# Patient Record
Sex: Male | Born: 1955 | Race: Black or African American | Hispanic: No | Smoking: Former smoker
Health system: Southern US, Community
[De-identification: ages and names within clinical notes are randomized; demographics above are authoritative.]

## PROBLEM LIST (undated history)

## (undated) DIAGNOSIS — F329 Major depressive disorder, single episode, unspecified: Secondary | ICD-10-CM

## (undated) DIAGNOSIS — G8929 Other chronic pain: Secondary | ICD-10-CM

## (undated) DIAGNOSIS — F32A Depression, unspecified: Secondary | ICD-10-CM

## (undated) DIAGNOSIS — M549 Dorsalgia, unspecified: Secondary | ICD-10-CM

---
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
## 1998-05-28 ENCOUNTER — Ambulatory Visit (HOSPITAL_COMMUNITY): Admission: RE | Admit: 1998-05-28 | Discharge: 1998-05-28 | Payer: Self-pay | Admitting: Family Medicine

## 1998-05-28 ENCOUNTER — Encounter: Payer: Self-pay | Admitting: Family Medicine

## 1998-06-11 ENCOUNTER — Encounter: Payer: Self-pay | Admitting: Orthopedic Surgery

## 1998-06-11 ENCOUNTER — Ambulatory Visit (HOSPITAL_COMMUNITY): Admission: RE | Admit: 1998-06-11 | Discharge: 1998-06-11 | Payer: Self-pay | Admitting: Family Medicine

## 1998-06-25 ENCOUNTER — Ambulatory Visit (HOSPITAL_COMMUNITY): Admission: RE | Admit: 1998-06-25 | Discharge: 1998-06-25 | Payer: Self-pay | Admitting: Family Medicine

## 1998-08-14 ENCOUNTER — Ambulatory Visit (HOSPITAL_COMMUNITY): Admission: RE | Admit: 1998-08-14 | Discharge: 1998-08-14 | Payer: Self-pay | Admitting: Family Medicine

## 1998-09-29 ENCOUNTER — Ambulatory Visit (HOSPITAL_COMMUNITY): Admission: RE | Admit: 1998-09-29 | Discharge: 1998-09-29 | Payer: Self-pay | Admitting: Neurosurgery

## 1998-10-13 ENCOUNTER — Encounter: Admission: RE | Admit: 1998-10-13 | Discharge: 1998-11-21 | Payer: Self-pay | Admitting: Family Medicine

## 1998-10-21 ENCOUNTER — Ambulatory Visit (HOSPITAL_COMMUNITY): Admission: RE | Admit: 1998-10-21 | Discharge: 1998-10-21 | Payer: Self-pay | Admitting: Neurosurgery

## 1998-10-28 ENCOUNTER — Ambulatory Visit (HOSPITAL_COMMUNITY): Admission: RE | Admit: 1998-10-28 | Discharge: 1998-10-28 | Payer: Self-pay | Admitting: Family Medicine

## 1998-10-28 ENCOUNTER — Encounter: Payer: Self-pay | Admitting: Family Medicine

## 1999-01-19 ENCOUNTER — Ambulatory Visit (HOSPITAL_COMMUNITY): Admission: RE | Admit: 1999-01-19 | Discharge: 1999-01-19 | Payer: Self-pay | Admitting: Family Medicine

## 1999-01-23 ENCOUNTER — Encounter: Payer: Self-pay | Admitting: Family Medicine

## 1999-01-23 ENCOUNTER — Ambulatory Visit (HOSPITAL_COMMUNITY): Admission: RE | Admit: 1999-01-23 | Discharge: 1999-01-23 | Payer: Self-pay | Admitting: Family Medicine

## 1999-02-04 ENCOUNTER — Ambulatory Visit (HOSPITAL_COMMUNITY): Admission: RE | Admit: 1999-02-04 | Discharge: 1999-02-04 | Payer: Self-pay | Admitting: Family Medicine

## 1999-02-04 ENCOUNTER — Encounter: Payer: Self-pay | Admitting: Family Medicine

## 1999-03-17 ENCOUNTER — Ambulatory Visit (HOSPITAL_COMMUNITY): Admission: RE | Admit: 1999-03-17 | Discharge: 1999-03-17 | Payer: Self-pay | Admitting: Orthopaedic Surgery

## 1999-08-27 ENCOUNTER — Emergency Department (HOSPITAL_COMMUNITY): Admission: EM | Admit: 1999-08-27 | Discharge: 1999-08-27 | Payer: Self-pay | Admitting: *Deleted

## 1999-10-22 ENCOUNTER — Ambulatory Visit (HOSPITAL_COMMUNITY): Admission: RE | Admit: 1999-10-22 | Discharge: 1999-10-22 | Payer: Self-pay | Admitting: Neurosurgery

## 2000-02-11 ENCOUNTER — Emergency Department (HOSPITAL_COMMUNITY): Admission: EM | Admit: 2000-02-11 | Discharge: 2000-02-11 | Payer: Self-pay | Admitting: Emergency Medicine

## 2000-02-13 ENCOUNTER — Emergency Department (HOSPITAL_COMMUNITY): Admission: EM | Admit: 2000-02-13 | Discharge: 2000-02-13 | Payer: Self-pay | Admitting: Emergency Medicine

## 2001-02-21 ENCOUNTER — Emergency Department (HOSPITAL_COMMUNITY): Admission: EM | Admit: 2001-02-21 | Discharge: 2001-02-21 | Payer: Self-pay | Admitting: Emergency Medicine

## 2001-03-21 ENCOUNTER — Encounter: Payer: Self-pay | Admitting: Family Medicine

## 2001-03-21 ENCOUNTER — Ambulatory Visit (HOSPITAL_COMMUNITY): Admission: RE | Admit: 2001-03-21 | Discharge: 2001-03-21 | Payer: Self-pay | Admitting: Family Medicine

## 2001-05-11 ENCOUNTER — Encounter: Admission: RE | Admit: 2001-05-11 | Discharge: 2001-08-09 | Payer: Self-pay

## 2001-06-22 ENCOUNTER — Encounter: Admission: RE | Admit: 2001-06-22 | Discharge: 2001-07-12 | Payer: Self-pay

## 2001-08-17 ENCOUNTER — Encounter: Admission: RE | Admit: 2001-08-17 | Discharge: 2001-09-20 | Payer: Self-pay

## 2001-09-14 ENCOUNTER — Emergency Department (HOSPITAL_COMMUNITY): Admission: EM | Admit: 2001-09-14 | Discharge: 2001-09-14 | Payer: Self-pay | Admitting: Emergency Medicine

## 2001-09-20 ENCOUNTER — Encounter: Admission: RE | Admit: 2001-09-20 | Discharge: 2001-12-19 | Payer: Self-pay

## 2001-12-12 ENCOUNTER — Encounter: Payer: Self-pay | Admitting: Emergency Medicine

## 2001-12-12 ENCOUNTER — Emergency Department (HOSPITAL_COMMUNITY): Admission: EM | Admit: 2001-12-12 | Discharge: 2001-12-13 | Payer: Self-pay | Admitting: *Deleted

## 2002-10-04 ENCOUNTER — Emergency Department (HOSPITAL_COMMUNITY): Admission: EM | Admit: 2002-10-04 | Discharge: 2002-10-04 | Payer: Self-pay | Admitting: Emergency Medicine

## 2002-10-04 ENCOUNTER — Encounter: Payer: Self-pay | Admitting: Emergency Medicine

## 2003-01-08 ENCOUNTER — Encounter: Payer: Self-pay | Admitting: Emergency Medicine

## 2003-01-08 ENCOUNTER — Emergency Department (HOSPITAL_COMMUNITY): Admission: EM | Admit: 2003-01-08 | Discharge: 2003-01-09 | Payer: Self-pay | Admitting: Emergency Medicine

## 2004-11-19 ENCOUNTER — Emergency Department (HOSPITAL_COMMUNITY): Admission: EM | Admit: 2004-11-19 | Discharge: 2004-11-20 | Payer: Self-pay | Admitting: Emergency Medicine

## 2004-11-21 ENCOUNTER — Emergency Department (HOSPITAL_COMMUNITY): Admission: EM | Admit: 2004-11-21 | Discharge: 2004-11-21 | Payer: Self-pay | Admitting: Emergency Medicine

## 2013-03-18 ENCOUNTER — Encounter (HOSPITAL_COMMUNITY): Payer: Self-pay | Admitting: *Deleted

## 2013-03-18 ENCOUNTER — Emergency Department (HOSPITAL_COMMUNITY)
Admission: EM | Admit: 2013-03-18 | Discharge: 2013-03-18 | Disposition: A | Discharge status: Home / Self Care | Payer: Non-veteran care | Attending: Emergency Medicine | Admitting: Emergency Medicine

## 2013-03-18 DIAGNOSIS — R21 Rash and other nonspecific skin eruption: Secondary | ICD-10-CM

## 2013-03-18 DIAGNOSIS — L299 Pruritus, unspecified: Secondary | ICD-10-CM | POA: Insufficient documentation

## 2013-03-18 DIAGNOSIS — G8929 Other chronic pain: Secondary | ICD-10-CM | POA: Insufficient documentation

## 2013-03-18 HISTORY — DX: Other chronic pain: G89.29

## 2013-03-18 HISTORY — DX: Dorsalgia, unspecified: M54.9

## 2013-03-18 MED ORDER — HYDROCORTISONE 2.5 % EX LOTN: TOPICAL_LOTION | Freq: Two times a day (BID) | CUTANEOUS | Status: DC

## 2013-03-18 MED ORDER — PREDNISONE 20 MG PO TABS: 40.0000 mg | ORAL_TABLET | Freq: Every day | ORAL | Status: DC

## 2013-03-18 NOTE — ED Provider Notes (Signed)
History  This chart was scribed for non-physician practitioner working with Carleene Cooper III, MD. This patient was seen in room TR05C/TR05C and the patient's care was started at 5:04 PM.  CSN: [id number]  Arrival date & time 03/18/13  1644   Chief Complaint  Patient presents with  . Rash    The history is provided by the patient. No language interpreter was used.   HPI Comments: Ian Montoya is a 57 y.o. male who presents to the Emergency Department complaining of a gradually improving rash to his face, abdomen and back first noticed yesterday morning when pt woke up. The rash resembles insect bites and there is associated itching but pt denies associated pain or drainage. Pt denies sleeping in new environments, states that he has been sleeping in his car. Pt denies presence of rash on his genitals, buttocks region or airway. He denies fever, chills, difficulty breathing or any other symptoms. Pt is an occasional alcohol user and denies smoking.  PCP- None   Past Medical History  Diagnosis Date  . Back pain, chronic    History reviewed. No pertinent past surgical history.  No family history on file.  History  Substance Use Topics  . Smoking status: Former Games developer  . Smokeless tobacco: Not on file  . Alcohol Use: Yes     Comment: occ    Review of Systems  Constitutional: Negative for fever and chills.  Respiratory: Negative for shortness of breath and wheezing.   Skin: Positive for rash.   Allergies  Tylenol  Home Medications   Current Outpatient Rx  Name  Route  Sig  Dispense  Refill  . hydrocortisone 2.5 % lotion   Topical   Apply topically 2 (two) times daily.   59 mL   0   . predniSONE (DELTASONE) 20 MG tablet   Oral   Take 2 tablets (40 mg total) by mouth daily.   10 tablet   0     Triage Vitals: BP 124/83  Pulse 74  Temp(Src) 98.2 F (36.8 C) (Oral)  Resp 16  SpO2 97%  Physical Exam  Nursing note and vitals reviewed. Constitutional: He is  oriented to person, place, and time. He appears well-developed and well-nourished. No distress.  HENT:  Head: Normocephalic and atraumatic.  Mouth/Throat: Uvula is midline and oropharynx is clear and moist. No oropharyngeal exudate, posterior oropharyngeal edema or posterior oropharyngeal erythema.  No oropharynx involvement.  Eyes: Conjunctivae are normal.  Neck: Neck supple.  Cardiovascular: Normal rate, regular rhythm and normal heart sounds.   Pulmonary/Chest: Effort normal and breath sounds normal. No accessory muscle usage. Not tachypneic and not bradypneic. No respiratory distress. He has no wheezes.  Neurological: He is alert and oriented to person, place, and time.  Skin: Skin is warm and dry. Rash noted. Rash is maculopapular. He is not diaphoretic.  Face, neck, stomach, back, legs involved. No genitalia involvement. Small abrasions noted on face, patient endorses scratching. No drainage or bleeding.   Psychiatric: He has a normal mood and affect.    ED Course  Procedures (including critical care time)  DIAGNOSTIC STUDIES: Oxygen Saturation is 97% on RA, normal by my interpretation.    COORDINATION OF CARE: 5:35 PM- Pt advised of plan to receive a prednisone prescription upon discharge and pt agrees. Pt also     Labs Reviewed - No data to display  No results found.  1. Rash and nonspecific skin eruption     MDM  No evidence of  SJS or necrotizing fasciitis. Due to pruritic and not painful nature of blisters do not suspect pemphigus vulgaris. Pustules do not resemble scabies as per pt hx or allergic reaction. No blisters, no pustules, no warmth, no draining sinus tracts, no superficial abscesses, no bullous impetigo, no vesicles, no desquamation, no target lesions with dusky purpura or a central bulla. Not tender to touch. No fevers or chills. Return precautions discussed. Patient is agreeable to plan. Patient is stable at time of discharge.      I personally performed  the services described in this documentation, which was scribed in my presence. The recorded information has been reviewed and is accurate.     Jeannetta Ellis, PA-C 03/18/13 1854

## 2013-03-18 NOTE — ED Notes (Signed)
Pt is here with rash versus bites to right face, abdomen, and back

## 2013-03-19 NOTE — ED Provider Notes (Signed)
Medical screening examination/treatment/procedure(s) were performed by non-physician practitioner and as supervising physician I was immediately available for consultation/collaboration.   Amarah Brossman III, MD 03/19/13 1756 

## 2013-08-28 ENCOUNTER — Emergency Department (HOSPITAL_COMMUNITY)
Admission: EM | Admit: 2013-08-28 | Discharge: 2013-08-28 | Disposition: A | Discharge status: Home / Self Care | Payer: Non-veteran care | Attending: Emergency Medicine | Admitting: Emergency Medicine

## 2013-08-28 ENCOUNTER — Encounter (HOSPITAL_COMMUNITY): Payer: Self-pay | Admitting: Emergency Medicine

## 2013-08-28 DIAGNOSIS — M545 Low back pain, unspecified: Secondary | ICD-10-CM | POA: Insufficient documentation

## 2013-08-28 DIAGNOSIS — Z888 Allergy status to other drugs, medicaments and biological substances status: Secondary | ICD-10-CM | POA: Insufficient documentation

## 2013-08-28 DIAGNOSIS — G8929 Other chronic pain: Secondary | ICD-10-CM | POA: Insufficient documentation

## 2013-08-28 DIAGNOSIS — Z87891 Personal history of nicotine dependence: Secondary | ICD-10-CM | POA: Insufficient documentation

## 2013-08-28 MED ORDER — HYDROMORPHONE HCL PF 1 MG/ML IJ SOLN
1.0000 mg | Freq: Once | INTRAMUSCULAR | Status: AC
  Administered 2013-08-28: 1 mg via INTRAMUSCULAR
  Filled 2013-08-28: qty 1

## 2013-08-28 MED ORDER — PREDNISONE 10 MG PO TABS: 20.0000 mg | ORAL_TABLET | Freq: Every day | ORAL | Status: DC

## 2013-08-28 MED ORDER — OXYCODONE HCL 5 MG PO TABS: 5.0000 mg | ORAL_TABLET | Freq: Four times a day (QID) | ORAL | Status: DC | PRN

## 2013-08-28 NOTE — ED Provider Notes (Signed)
Medical screening examination/treatment/procedure(s) were performed by non-physician practitioner and as supervising physician I was immediately available for consultation/collaboration.    Celene Kras, MD 08/28/13 [phone]

## 2013-08-28 NOTE — ED Provider Notes (Signed)
CSN: [id number]     Arrival date & time 08/28/13  1411 History  This chart was scribed for Felicie Morn, NP, working with Celene Kras, MD, by Ardelia Mems ED Scribe. This patient was seen in room TR06C/TR06C and the patient's care was started at 3:20 PM.   Chief Complaint  Patient presents with  . Back Pain    Patient is a 57 y.o. male presenting with back pain. The history is provided by the patient. No language interpreter was used.  Back Pain Location:  Lumbar spine Quality:  Aching Radiates to:  L posterior upper leg and R posterior upper leg Pain severity:  Moderate Onset quality:  Gradual Duration:  1 day Timing:  Constant Progression:  Worsening Chronicity:  Chronic (but acutely worsened) Context: not recent injury   Relieved by:  Nothing Worsened by:  Nothing tried Ineffective treatments:  None tried (ran out of prescribed pain medications) Associated symptoms: no abdominal pain, no bladder incontinence, no bowel incontinence, no fever, no numbness and no weakness     HPI Comments: Ian Montoya is a 57 y.o. Male with a history of chronic lower back pain who presents to the Emergency Department complaining of acutely worsened lower back pain today. He denies any recent injury to have onset worsened his pain. He states that he has ran out of his prescribed pain medications, and that he dropped his last remaining pills in the toilet accidentally this morning. He states that he is followed for this pain by the [place] in [place]. He states that he has not been on any steroids recently. He states that he is able to ambulate normally, but that walking worsens his pain. He denies any history of DM. He denies bowel or bladder incontinence or any other pain or symptoms.   Past Medical History  Diagnosis Date  . Back pain, chronic    History reviewed. No pertinent past surgical history. History reviewed. No pertinent family history. History  Substance Use Topics  . Smoking status:  Former Games developer  . Smokeless tobacco: Not on file  . Alcohol Use: Yes     Comment: occ    Review of Systems  Constitutional: Negative for fever.  Gastrointestinal: Negative for abdominal pain and bowel incontinence.  Genitourinary: Negative for bladder incontinence.  Musculoskeletal: Positive for back pain.  Neurological: Negative for weakness and numbness.  All other systems reviewed and are negative.   Allergies  Tylenol  Home Medications   Current Outpatient Rx  Name  Route  Sig  Dispense  Refill  . oxyCODONE (ROXICODONE) 5 MG immediate release tablet   Oral   Take 1 tablet (5 mg total) by mouth every 6 (six) hours as needed for severe pain.   15 tablet   0   . predniSONE (DELTASONE) 10 MG tablet   Oral   Take 2 tablets (20 mg total) by mouth daily.   14 tablet   0    Triage Vitals: BP 135/92  Pulse 69  Temp(Src) 97.6 F (36.4 C) (Oral)  Resp 18  SpO2 97%  Physical Exam  Nursing note and vitals reviewed. Constitutional: He is oriented to person, place, and time. He appears well-developed and well-nourished. No distress.  HENT:  Head: Normocephalic and atraumatic.  Eyes: EOM are normal.  Neck: Neck supple. No tracheal deviation present.  Cardiovascular: Normal rate.   Pulmonary/Chest: Effort normal. No respiratory distress.  Musculoskeletal: Normal range of motion.  Chronic low back pain.  Neurological: He is alert  and oriented to person, place, and time.  Able to ambulate.  Skin: Skin is warm and dry.  Psychiatric: He has a normal mood and affect. His behavior is normal.    ED Course  Procedures (including critical care time)  DIAGNOSTIC STUDIES: Oxygen Saturation is 97% on RA, normal by my interpretation.    COORDINATION OF CARE: 3:25 PM- Will order Dilaudid. Will discharge with Prednisone and Oxycodone. Pt advised of plan for treatment and pt agrees.  Medications  HYDROmorphone (DILAUDID) injection 1 mg (1 mg Intramuscular Given 08/28/13 1537)    Labs Review Labs Reviewed - No data to display Imaging Review No results found.  EKG Interpretation   None       MDM  Chronic back pain.  [facility].  Patient states he "lost" his pain medication in the toilet this morning.  No red flag symptoms.  I personally performed the services described in this documentation, which was scribed in my presence. The recorded information has been reviewed and is accurate.    Jimmye Norman, NP 08/28/13 [phone]

## 2013-08-28 NOTE — ED Notes (Signed)
Pt c/o chronic pain that is increased in his back and down legs; pt sts out of pain meds; pt denies new injury

## 2017-02-02 ENCOUNTER — Emergency Department (HOSPITAL_COMMUNITY)
Admission: EM | Admit: 2017-02-02 | Discharge: 2017-02-02 | Disposition: A | Discharge status: Home / Self Care | Payer: Non-veteran care | Attending: Emergency Medicine | Admitting: Emergency Medicine

## 2017-02-02 ENCOUNTER — Encounter (HOSPITAL_COMMUNITY): Payer: Self-pay

## 2017-02-02 DIAGNOSIS — H9201 Otalgia, right ear: Secondary | ICD-10-CM | POA: Insufficient documentation

## 2017-02-02 DIAGNOSIS — K047 Periapical abscess without sinus: Secondary | ICD-10-CM | POA: Insufficient documentation

## 2017-02-02 DIAGNOSIS — Z87891 Personal history of nicotine dependence: Secondary | ICD-10-CM | POA: Insufficient documentation

## 2017-02-02 MED ORDER — AMOXICILLIN 500 MG PO CAPS: 500.0000 mg | ORAL_CAPSULE | Freq: Three times a day (TID) | ORAL | 0 refills | Status: DC

## 2017-02-02 MED ORDER — NAPROXEN 500 MG PO TABS: 500.0000 mg | ORAL_TABLET | Freq: Two times a day (BID) | ORAL | 0 refills | Status: DC

## 2017-02-02 NOTE — ED Provider Notes (Signed)
[facility]-EMERGENCY DEPT Provider Note   CSN: [id number] Arrival date & time: 02/02/17  1841   By signing my name below, I, Soijett Blue, attest that this documentation has been prepared under the direction and in the presence of Ian BuffaloHope Kion Huntsberry, NP Electronically Signed: Soijett Blue, ED Scribe. 02/02/17. 9:01 PM.  History   Chief Complaint No chief complaint on file.   HPI Ian Montoya is a 61 y.o. male who presents to the Emergency Department complaining of constant, right ear pain onset 2 days ago. Pt reports associated right lower dental pain. Pt has not tried any medications for the relief of his symptoms. Denies nasal congestion, fever, chills, nausea, vomiting, gland swelling, abdominal pain, and any other symptoms.    The history is provided by the patient. No language interpreter was used.  Otalgia  This is a new problem. The current episode started 2 days ago. There is pain in the right ear. The problem occurs constantly. The problem has not changed since onset.There has been no fever. Pertinent negatives include no abdominal pain, no vomiting, no neck pain and no rash.    Past Medical History:  Diagnosis Date  . Back pain, chronic     There are no active problems to display for this patient.   History reviewed. No pertinent surgical history.     Home Medications    Prior to Admission medications   Medication Sig Start Date End Date Taking? Authorizing Provider  amoxicillin (AMOXIL) 500 MG capsule Take 1 capsule (500 mg total) by mouth 3 (three) times daily. 02/02/17   Janne NapoleonNeese, Stephonie Wilcoxen M, NP  naproxen (NAPROSYN) 500 MG tablet Take 1 tablet (500 mg total) by mouth 2 (two) times daily. 02/02/17   Janne NapoleonNeese, Jacquelyne Quarry M, NP  oxyCODONE (ROXICODONE) 5 MG immediate release tablet Take 1 tablet (5 mg total) by mouth every 6 (six) hours as needed for severe pain. 08/28/13   Felicie MornSmith, David, NP  predniSONE (DELTASONE) 10 MG tablet Take 2 tablets (20 mg total) by mouth daily. 08/28/13   Felicie MornSmith,  David, NP    Family History No family history on file.  Social History Social History  Substance Use Topics  . Smoking status: Former Games developermoker  . Smokeless tobacco: Never Used  . Alcohol use Yes     Comment: occ     Allergies   Tylenol [acetaminophen]   Review of Systems Review of Systems  Constitutional: Negative for chills and fever.  HENT: Positive for dental problem (right lower) and ear pain (right). Negative for congestion.   Respiratory: Negative for shortness of breath.   Gastrointestinal: Negative for abdominal pain, nausea and vomiting.  Musculoskeletal: Negative for neck pain.  Skin: Negative for rash.  Hematological: Negative for adenopathy.  Psychiatric/Behavioral: The patient is not nervous/anxious.      Physical Exam Updated Vital Signs BP 121/81 (BP Location: Right Arm)   Pulse 76   Temp 99.1 F (37.3 C) (Oral)   Resp 18   SpO2 97%   Physical Exam  Constitutional: He is oriented to person, place, and time. He appears well-developed and well-nourished. No distress.  HENT:  Head: Normocephalic and atraumatic.  Right Ear: Tympanic membrane and ear canal normal.  Left Ear: Tympanic membrane and ear canal normal.  Mouth/Throat: Uvula is midline, oropharynx is clear and moist and mucous membranes are normal. Abnormal dentition. Dental abscesses and dental caries present. No posterior oropharyngeal edema or posterior oropharyngeal erythema.  Second molar on right lower decayed to gumline. Swelling and tenderness  of gum surrounding the tooth. Abscessed tooth to the 1st and 2nd molar to left lower and the abscess is draining. Multiple dental caries to the upper dental area. No TMJ click or tenderness.  Eyes: Conjunctivae and EOM are normal.  Neck: Normal range of motion. Neck supple.  Cardiovascular: Normal rate and regular rhythm.   Pulmonary/Chest: Effort normal and breath sounds normal.  Abdominal: Soft. There is no tenderness.  Musculoskeletal: Normal  range of motion.  Lymphadenopathy:    He has no cervical adenopathy.  Neurological: He is alert and oriented to person, place, and time.  Skin: Skin is warm and dry.  Psychiatric: He has a normal mood and affect. His behavior is normal.  Nursing note and vitals reviewed.    ED Treatments / Results  DIAGNOSTIC STUDIES: Oxygen Saturation is 97% on RA, nl by my interpretation.    COORDINATION OF CARE: 8:59 PM Discussed treatment plan with pt at bedside which includes abx Rx, referral to dentist, and pt agreed to plan.   Labs (all labs ordered are listed, but only abnormal results are displayed) Labs Reviewed - No data to display  Radiology No results found.  Procedures Procedures (including critical care time)  Medications Ordered in ED Medications - No data to display   Initial Impression / Assessment and Plan / ED Course  I have reviewed the triage vital signs and the nursing notes.  Patient right ear pain is referred pain from multiple right upper and lower dental caries and abscessed teeth. Pt with dentalgia.  No abscess requiring immediate incision and drainage.  Exam not concerning for Ludwig's angina or pharyngeal abscess.  Will treat with amoxil Rx. Pt instructed to follow-up with dentist.  Discussed return precautions. Pt safe for discharge.  Final Clinical Impressions(s) / ED Diagnoses   Final diagnoses:  Dental abscess    New Prescriptions Discharge Medication List as of 02/02/2017  9:02 PM    START taking these medications   Details  amoxicillin (AMOXIL) 500 MG capsule Take 1 capsule (500 mg total) by mouth 3 (three) times daily., Starting Wed 02/02/2017, Print    naproxen (NAPROSYN) 500 MG tablet Take 1 tablet (500 mg total) by mouth 2 (two) times daily., Starting Wed 02/02/2017, Print      I personally performed the services described in this documentation, which was scribed in my presence. The recorded information has been reviewed and is accurate.      Ian Buffalo [place], [place] 02/03/17 [employer]    Derwood Kaplan, MD 02/04/17 1655

## 2017-02-02 NOTE — ED Triage Notes (Signed)
Patient complains of right ear pain x 2 days, no cold symptoms associated with same. Thinks started after using q-tip, NAD

## 2017-02-02 NOTE — Discharge Instructions (Signed)
Take the medication as directed. Call Dr. Kelly SplinterKoelling's office in the morning for follow up.

## 2019-03-03 ENCOUNTER — Encounter (HOSPITAL_COMMUNITY): Payer: Self-pay | Admitting: Emergency Medicine

## 2019-03-03 ENCOUNTER — Other Ambulatory Visit: Payer: Self-pay

## 2019-03-03 ENCOUNTER — Emergency Department (HOSPITAL_COMMUNITY): Payer: No Typology Code available for payment source

## 2019-03-03 ENCOUNTER — Emergency Department (HOSPITAL_COMMUNITY)
Admission: EM | Admit: 2019-03-03 | Discharge: 2019-03-03 | Disposition: A | Discharge status: Home / Self Care | Payer: No Typology Code available for payment source | Attending: Emergency Medicine | Admitting: Emergency Medicine

## 2019-03-03 DIAGNOSIS — Y9389 Activity, other specified: Secondary | ICD-10-CM | POA: Diagnosis not present

## 2019-03-03 DIAGNOSIS — Z87891 Personal history of nicotine dependence: Secondary | ICD-10-CM | POA: Diagnosis not present

## 2019-03-03 DIAGNOSIS — G8911 Acute pain due to trauma: Secondary | ICD-10-CM | POA: Insufficient documentation

## 2019-03-03 DIAGNOSIS — Y999 Unspecified external cause status: Secondary | ICD-10-CM | POA: Insufficient documentation

## 2019-03-03 DIAGNOSIS — W11XXXA Fall on and from ladder, initial encounter: Secondary | ICD-10-CM | POA: Insufficient documentation

## 2019-03-03 DIAGNOSIS — S4992XA Unspecified injury of left shoulder and upper arm, initial encounter: Secondary | ICD-10-CM | POA: Diagnosis not present

## 2019-03-03 DIAGNOSIS — M87022 Idiopathic aseptic necrosis of left humerus: Secondary | ICD-10-CM | POA: Diagnosis not present

## 2019-03-03 DIAGNOSIS — Y92008 Other place in unspecified non-institutional (private) residence as the place of occurrence of the external cause: Secondary | ICD-10-CM | POA: Insufficient documentation

## 2019-03-03 DIAGNOSIS — M25512 Pain in left shoulder: Secondary | ICD-10-CM

## 2019-03-03 MED ORDER — OXYCODONE HCL 5 MG PO TABS
5.0000 mg | ORAL_TABLET | Freq: Once | ORAL | Status: AC
  Administered 2019-03-03: 5 mg via ORAL
  Filled 2019-03-03: qty 1

## 2019-03-03 MED ORDER — LIDOCAINE 5 % EX PTCH: 1.0000 | MEDICATED_PATCH | CUTANEOUS | 0 refills | Status: DC

## 2019-03-03 NOTE — ED Notes (Signed)
Patient transported to X-ray 

## 2019-03-03 NOTE — ED Triage Notes (Signed)
Pt here for shoulder pain following fall off of step later. States he fell on concrete after slipping and pain to left shoulder. Pt able to move arm freely. Small abrasion noted to top of shoulder. No syncope, LOC

## 2019-03-03 NOTE — ED Provider Notes (Signed)
[facility] Emergency Department Provider Note MRN:  [id number]  Arrival date & time: 03/03/19     Chief Complaint   Shoulder Pain   History of Present Illness   Ian Montoya is a 63 y.o. year-old male with a history of chronic back pain presenting to the ED with chief complaint of shoulder pain.  Shortly prior to arrival, patient was on a 2 or 3 step ladder reaching for something in the garage, upon trying to step down off the small step ladder, he lost his balance, fell onto his left shoulder.  Denies head trauma, no loss of consciousness, no nausea or vomiting, no neck pain, back pain is at baseline, no chest pain or shortness of breath, no abdominal pain.  Endorsing pain to the left shoulder, worse with motion, moderate in severity.  Review of Systems  A complete 10 system review of systems was obtained and all systems are negative except as noted in the HPI and PMH.   Patient's Health History    Past Medical History:  Diagnosis Date  . Back pain, chronic     History reviewed. No pertinent surgical history.  No family history on file.  Social History   Socioeconomic History  . Marital status: Divorced    Spouse name: Not on file  . Number of children: Not on file  . Years of education: Not on file  . Highest education level: Not on file  Occupational History  . Not on file  Social Needs  . Financial resource strain: Not on file  . Food insecurity    Worry: Not on file    Inability: Not on file  . Transportation needs    Medical: Not on file    Non-medical: Not on file  Tobacco Use  . Smoking status: Former Research scientist (life sciences)  . Smokeless tobacco: Never Used  Substance and Sexual Activity  . Alcohol use: Yes    Comment: occ  . Drug use: No  . Sexual activity: Not on file  Lifestyle  . Physical activity    Days per week: Not on file    Minutes per session: Not on file  . Stress: Not on file  Relationships  . Social Herbalist on phone:  Not on file    Gets together: Not on file    Attends religious service: Not on file    Active member of club or organization: Not on file    Attends meetings of clubs or organizations: Not on file    Relationship status: Not on file  . Intimate partner violence    Fear of current or ex partner: Not on file    Emotionally abused: Not on file    Physically abused: Not on file    Forced sexual activity: Not on file  Other Topics Concern  . Not on file  Social History Narrative  . Not on file     Physical Exam  Vital Signs and Nursing Notes reviewed Vitals:   03/03/19 1230 03/03/19 1315  Pulse: 70 62  Resp:    Temp:    SpO2: 97% 96%    CONSTITUTIONAL: Well-appearing, NAD NEURO:  Alert and oriented x 3, no focal deficits EYES:  eyes equal and reactive ENT/NECK:  no LAD, no JVD CARDIO: Regular rate, well-perfused, normal S1 and S2 PULM:  CTAB no wheezing or rhonchi GI/GU:  normal bowel sounds, non-distended, non-tender MSK/SPINE:  No gross deformities, no edema, intermittent pain elicited to the left  shoulder with range of motion, which is grossly intact SKIN:  no rash, atraumatic PSYCH:  Appropriate speech and behavior  Diagnostic and Interventional Summary    EKG Interpretation  Date/Time:  Saturday March 03 2019 12:34:01 EDT Ventricular Rate:  68 PR Interval:    QRS Duration: 91 QT Interval:  397 QTC Calculation: 423 R Axis:   22 Text Interpretation:  Sinus rhythm Abnormal R-wave progression, early transition Confirmed by Kennis CarinaBero, Rilley Poulter [phone]([id number]) on 03/03/2019 1:36:36 PM      Labs Reviewed - No data to display  DG Shoulder Left  Final Result      Medications  oxyCODONE (Oxy IR/ROXICODONE) immediate release tablet 5 mg (5 mg Oral Given 03/03/19 1235)     Procedures Critical Care  ED Course and Medical Decision Making  I have reviewed the triage vital signs and the nursing notes.  Pertinent labs & imaging results that were available during my care of the  patient were reviewed by me and considered in my medical decision making (see below for details).  Suspect MSK strain or bruising related to the fall, nothing to suggest intracranial, intra-abdominal, or intrathoracic trauma, normal vital signs.  EKG and shoulder x-ray pending, anticipating discharge.  EKG is without acute concerns, x-ray reveals mild AC joint injury, also reveals some more chronic likely avascular necrosis of the humeral head.  Provided with sling for comfort, advised to range the shoulder a few times a day to prevent frozen shoulder, advised that he needs follow-up with a specialist within the next 1 to 2 weeks, more so for his possible avascular necrosis.  After the discussed management above, the patient was determined to be safe for discharge.  The patient was in agreement with this plan and all questions regarding their care were answered.  ED return precautions were discussed and the patient will return to the ED with any significant worsening of condition.  Elmer SowMichael M. Pilar PlateBero, MD [facility] [email or web address]  Final Clinical Impressions(s) / ED Diagnoses     ICD-10-CM   1. Acute pain of left shoulder  M25.512   2. Injury of left acromioclavicular joint, initial encounter  S49.92XA   3. 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

## 2019-03-03 NOTE — Discharge Instructions (Addendum)
You were evaluated in the Emergency Department and after careful evaluation, we did not find any emergent condition requiring admission or further testing in the hospital.  Your symptoms today seem to be due to an injury to the [facility] joint of your shoulder.  The x-ray also showed some chronic changes to your shoulder that need to be followed up with by an orthopedic specialist within the next 1 to 2 weeks.  If you cannot reach your [place] specialists, you can call the number provided.  Use the shoulder sling as needed for comfort, but be sure to range the shoulder a few times a day to prevent your shoulder from freezing up.  Please return to the Emergency Department if you experience any worsening of your condition.  We encourage you to follow up with a primary care provider.  Thank you for allowing [place] to be a part of your care.

## 2019-05-19 ENCOUNTER — Encounter (HOSPITAL_COMMUNITY): Payer: Self-pay | Admitting: Emergency Medicine

## 2019-05-19 ENCOUNTER — Emergency Department (HOSPITAL_COMMUNITY)
Admission: EM | Admit: 2019-05-19 | Discharge: 2019-05-19 | Disposition: A | Discharge status: Home / Self Care | Payer: No Typology Code available for payment source | Attending: Emergency Medicine | Admitting: Emergency Medicine

## 2019-05-19 ENCOUNTER — Other Ambulatory Visit: Payer: Self-pay

## 2019-05-19 DIAGNOSIS — Z4800 Encounter for change or removal of nonsurgical wound dressing: Secondary | ICD-10-CM | POA: Insufficient documentation

## 2019-05-19 DIAGNOSIS — Z87891 Personal history of nicotine dependence: Secondary | ICD-10-CM | POA: Insufficient documentation

## 2019-05-19 DIAGNOSIS — Z5189 Encounter for other specified aftercare: Secondary | ICD-10-CM

## 2019-05-19 MED ORDER — BACITRACIN ZINC 500 UNIT/GM EX OINT: TOPICAL_OINTMENT | Freq: Two times a day (BID) | CUTANEOUS | Status: DC

## 2019-05-19 NOTE — ED Provider Notes (Signed)
[facility] Provider Note   CSN: [id number] Arrival date & time: 05/19/19  1427     History   Chief Complaint Chief Complaint  Patient presents with  . Nail Problem    HPI Ian Montoya is a 63 y.o. male.     63yo male presents for wound check.  Patient had both of his great toenails removed 2 weeks ago at the [facility] in [place].  Patient is applying an unknown brown ointment to the wounds with daily dressing changes, also taking unknown round pill, possibly an antibiotic.  Patient does not know when his follow-up visit with the [facility] but wanted to have his wounds checked today and did not have time to drive out to [facility].  No specific concerns regarding his healing.     Past Medical History:  Diagnosis Date  . Back pain, chronic     There are no active problems to display for this patient.   History reviewed. No pertinent surgical history.      Home Medications    Prior to Admission medications   Medication Sig Start Date End Date Taking? Authorizing Provider  amoxicillin (AMOXIL) 500 MG capsule Take 1 capsule (500 mg total) by mouth 3 (three) times daily. 02/02/17   Ashley Murrain, NP  lidocaine (LIDODERM) 5 % Place 1 patch onto the skin daily. Remove & Discard patch within 12 hours or as directed by MD 03/03/19   Maudie Flakes, MD  naproxen (NAPROSYN) 500 MG tablet Take 1 tablet (500 mg total) by mouth 2 (two) times daily. 02/02/17   Ashley Murrain, NP  oxyCODONE (ROXICODONE) 5 MG immediate release tablet Take 1 tablet (5 mg total) by mouth every 6 (six) hours as needed for severe pain. 08/28/13   Etta Quill, NP  predniSONE (DELTASONE) 10 MG tablet Take 2 tablets (20 mg total) by mouth daily. 08/28/13   Etta Quill, NP    Family History No family history on file.  Social History Social History   Tobacco Use  . Smoking status: Former Research scientist (life sciences)  . Smokeless tobacco: Never Used  Substance Use Topics  . Alcohol use: Yes   Comment: occ  . Drug use: No     Allergies   Tylenol [acetaminophen]   Review of Systems Review of Systems  Constitutional: Negative for fever.  Musculoskeletal: Negative for arthralgias and myalgias.  Skin: Positive for wound.  Allergic/Immunologic: Negative for immunocompromised state.  All other systems reviewed and are negative.    Physical Exam Updated Vital Signs BP (!) 166/100 (BP Location: Left Arm)   Pulse 82   Temp 98 F (36.7 C) (Oral)   Resp 16   SpO2 98%   Physical Exam Vitals signs and nursing note reviewed.  Constitutional:      General: He is not in acute distress.    Appearance: He is well-developed. He is not diaphoretic.  HENT:     Head: Normocephalic and atraumatic.  Cardiovascular:     Pulses: Normal pulses.  Pulmonary:     Effort: Pulmonary effort is normal.  Musculoskeletal: Normal range of motion.     Comments: Nails removed from left and right great toes.  No purulent drainage, no signs of infection.  Skin:    General: Skin is warm and dry.     Findings: No erythema.  Neurological:     Mental Status: He is alert and oriented to person, place, and time.     Sensory: No sensory deficit.  Psychiatric:        Behavior: Behavior normal.      ED Treatments / Results  Labs (all labs ordered are listed, but only abnormal results are displayed) Labs Reviewed - No data to display  EKG None  Radiology No results found.  Procedures Procedures (including critical care time)  Medications Ordered in ED Medications  bacitracin ointment (has no administration in time range)     Initial Impression / Assessment and Plan / ED Course  I have reviewed the triage vital signs and the nursing notes.  Pertinent labs & imaging results that were available during my care of the patient were reviewed by me and considered in my medical decision making (see chart for details).  Clinical Course as of May 19 1599  Sat May 19, 2019  [id number]  63 year old male presents for wound check.  Patient had his toenails removed from left and right great toes 2 weeks ago through the [facility], came to the ER today to have his toes looked at, no specific concerns.  On exam there is no evidence of infection, no erythema, no purulent drainage.  Advised patient to continue bandaging per direction from [facility] and recheck with [facility] provider.   [LM]    Clinical Course User Index [LM] Jeannie FendMurphy, Laura A, PA-C      Final Clinical Impressions(s) / ED Diagnoses   Final diagnoses:  Visit for wound check    ED Discharge Orders    None       Jeannie FendMurphy, Laura A, PA-C 05/19/19 1600    Rolan BuccoBelfi, Melanie, MD 05/19/19 1623

## 2019-05-19 NOTE — ED Triage Notes (Signed)
Patient states he has both of his big toe nails removed about 2 weeks ago. Patient here to have them looked at to make sure they are healing.

## 2019-05-19 NOTE — Discharge Instructions (Addendum)
Follow up with your [facility] provider for recheck.

## 2019-05-19 NOTE — ED Notes (Signed)
Patient verbalizes understanding of discharge instructions. Opportunity for questioning and answers were provided. Armband removed by staff, pt discharged from ED ambulatory.   

## 2019-10-22 ENCOUNTER — Other Ambulatory Visit: Payer: Self-pay

## 2019-10-22 ENCOUNTER — Emergency Department (HOSPITAL_COMMUNITY): Payer: No Typology Code available for payment source

## 2019-10-22 ENCOUNTER — Encounter (HOSPITAL_COMMUNITY): Payer: Self-pay | Admitting: Emergency Medicine

## 2019-10-22 ENCOUNTER — Observation Stay (HOSPITAL_COMMUNITY)
Admission: EM | Admit: 2019-10-22 | Discharge: 2019-10-23 | Disposition: A | Discharge status: Home / Self Care | Payer: No Typology Code available for payment source | Attending: Internal Medicine | Admitting: Internal Medicine

## 2019-10-22 DIAGNOSIS — J984 Other disorders of lung: Secondary | ICD-10-CM | POA: Insufficient documentation

## 2019-10-22 DIAGNOSIS — B349 Viral infection, unspecified: Secondary | ICD-10-CM

## 2019-10-22 DIAGNOSIS — G8929 Other chronic pain: Secondary | ICD-10-CM | POA: Diagnosis not present

## 2019-10-22 DIAGNOSIS — R630 Anorexia: Secondary | ICD-10-CM | POA: Insufficient documentation

## 2019-10-22 DIAGNOSIS — I10 Essential (primary) hypertension: Secondary | ICD-10-CM | POA: Insufficient documentation

## 2019-10-22 DIAGNOSIS — Z7982 Long term (current) use of aspirin: Secondary | ICD-10-CM | POA: Insufficient documentation

## 2019-10-22 DIAGNOSIS — Z87891 Personal history of nicotine dependence: Secondary | ICD-10-CM | POA: Insufficient documentation

## 2019-10-22 DIAGNOSIS — M8700 Idiopathic aseptic necrosis of unspecified bone: Secondary | ICD-10-CM | POA: Diagnosis not present

## 2019-10-22 DIAGNOSIS — M87059 Idiopathic aseptic necrosis of unspecified femur: Secondary | ICD-10-CM

## 2019-10-22 DIAGNOSIS — M549 Dorsalgia, unspecified: Secondary | ICD-10-CM | POA: Diagnosis not present

## 2019-10-22 DIAGNOSIS — Z79899 Other long term (current) drug therapy: Secondary | ICD-10-CM | POA: Diagnosis not present

## 2019-10-22 DIAGNOSIS — N179 Acute kidney failure, unspecified: Principal | ICD-10-CM | POA: Insufficient documentation

## 2019-10-22 DIAGNOSIS — M879 Osteonecrosis, unspecified: Secondary | ICD-10-CM | POA: Diagnosis not present

## 2019-10-22 DIAGNOSIS — F329 Major depressive disorder, single episode, unspecified: Secondary | ICD-10-CM | POA: Insufficient documentation

## 2019-10-22 DIAGNOSIS — R944 Abnormal results of kidney function studies: Secondary | ICD-10-CM | POA: Diagnosis not present

## 2019-10-22 DIAGNOSIS — R9389 Abnormal findings on diagnostic imaging of other specified body structures: Secondary | ICD-10-CM

## 2019-10-22 DIAGNOSIS — Z886 Allergy status to analgesic agent status: Secondary | ICD-10-CM | POA: Diagnosis not present

## 2019-10-22 DIAGNOSIS — F32A Depression, unspecified: Secondary | ICD-10-CM | POA: Diagnosis present

## 2019-10-22 DIAGNOSIS — Z20822 Contact with and (suspected) exposure to covid-19: Secondary | ICD-10-CM | POA: Insufficient documentation

## 2019-10-22 HISTORY — DX: Depression, unspecified: F32.A

## 2019-10-22 LAB — CBC
HCT: 45.6 % (ref 39.0–52.0)
Hemoglobin: 15 g/dL (ref 13.0–17.0)
MCH: 27.3 pg (ref 26.0–34.0)
MCHC: 32.9 g/dL (ref 30.0–36.0)
MCV: 83.1 fL (ref 80.0–100.0)
Platelets: 390 10*3/uL (ref 150–400)
RBC: 5.49 MIL/uL (ref 4.22–5.81)
RDW: 15 % (ref 11.5–15.5)
WBC: 5.7 10*3/uL (ref 4.0–10.5)
nRBC: 0 % (ref 0.0–0.2)

## 2019-10-22 LAB — BASIC METABOLIC PANEL
Anion gap: 15 (ref 5–15)
BUN: 17 mg/dL (ref 8–23)
CO2: 19 mmol/L — ABNORMAL LOW (ref 22–32)
Calcium: 9.2 mg/dL (ref 8.9–10.3)
Chloride: 105 mmol/L (ref 98–111)
Creatinine, Ser: 1.42 mg/dL — ABNORMAL HIGH (ref 0.61–1.24)
GFR calc Af Amer: 60 mL/min — ABNORMAL LOW (ref >60)
GFR calc non Af Amer: 51 mL/min — ABNORMAL LOW (ref >60)
Glucose, Bld: 108 mg/dL — ABNORMAL HIGH (ref 70–99)
Potassium: 3.7 mmol/L (ref 3.5–5.1)
Sodium: 139 mmol/L (ref 135–145)

## 2019-10-22 LAB — URINALYSIS, ROUTINE W REFLEX MICROSCOPIC
Glucose, UA: NEGATIVE mg/dL
Ketones, ur: 15 mg/dL — AB
Leukocytes,Ua: NEGATIVE
Nitrite: NEGATIVE
Protein, ur: >300 mg/dL — AB
Specific Gravity, Urine: >1.03 — ABNORMAL HIGH (ref 1.005–1.030)
pH: 5.5 (ref 5.0–8.0)

## 2019-10-22 LAB — URINALYSIS, MICROSCOPIC (REFLEX)

## 2019-10-22 LAB — RESPIRATORY PANEL BY RT PCR (FLU A&B, COVID)
Influenza A by PCR: NEGATIVE
Influenza B by PCR: NEGATIVE
SARS Coronavirus 2 by RT PCR: NEGATIVE

## 2019-10-22 LAB — POC SARS CORONAVIRUS 2 AG -  ED: SARS Coronavirus 2 Ag: NEGATIVE

## 2019-10-22 MED ORDER — ENOXAPARIN SODIUM 40 MG/0.4ML ~~LOC~~ SOLN
40.0000 mg | SUBCUTANEOUS | Status: DC
  Filled 2019-10-22: qty 0.4

## 2019-10-22 MED ORDER — SODIUM CHLORIDE 0.9 % IV BOLUS
1000.0000 mL | Freq: Once | INTRAVENOUS | Status: AC
  Administered 2019-10-22: 21:00:00 1000 mL via INTRAVENOUS

## 2019-10-22 MED ORDER — OXYCODONE HCL 5 MG PO TABS
15.0000 mg | ORAL_TABLET | Freq: Three times a day (TID) | ORAL | Status: DC
  Administered 2019-10-23: 15 mg via ORAL
  Filled 2019-10-22: qty 3

## 2019-10-22 MED ORDER — MELATONIN 3 MG PO TABS
3.0000 mg | ORAL_TABLET | Freq: Every day | ORAL | Status: DC
  Filled 2019-10-22 (×2): qty 1

## 2019-10-22 MED ORDER — FENTANYL 75 MCG/HR TD PT72
1.0000 | MEDICATED_PATCH | TRANSDERMAL | Status: DC
  Filled 2019-10-22: qty 1

## 2019-10-22 MED ORDER — SODIUM CHLORIDE 0.9 % IV BOLUS
1000.0000 mL | Freq: Once | INTRAVENOUS | Status: AC
  Administered 2019-10-22: 22:00:00 1000 mL via INTRAVENOUS

## 2019-10-22 MED ORDER — GABAPENTIN 300 MG PO CAPS
300.0000 mg | ORAL_CAPSULE | Freq: Three times a day (TID) | ORAL | Status: DC
  Administered 2019-10-23 (×2): 300 mg via ORAL
  Filled 2019-10-22 (×2): qty 1

## 2019-10-22 MED ORDER — MIRTAZAPINE 15 MG PO TABS
15.0000 mg | ORAL_TABLET | Freq: Every day | ORAL | Status: DC
  Administered 2019-10-23: 15 mg via ORAL
  Filled 2019-10-22: qty 1

## 2019-10-22 NOTE — ED Triage Notes (Signed)
Pt reports not feeling well the past few days. Endorses loss of appetite and chronic pain. Reports some diarrhea.

## 2019-10-22 NOTE — ED Notes (Signed)
Pt to CT via stretcher

## 2019-10-22 NOTE — ED Notes (Signed)
Patient ambulated to and from bathroom with a steady gait. 

## 2019-10-22 NOTE — H&P (Signed)
[facility] History and Physical  VERTIS SCHEIB ZOX:[id number] DOB: 03-13-1956 DOA: 10/22/2019  Referring physician: Orson Aloe, MD PCP: Patient, No Pcp Per   Chief Complaint: poor appetite  HPI: Ian Montoya is a 64 y.o. male with hx of chronic back pain on chronic opioids who presents for poor appetite.  Reports has not been able to eat for about a week. Has also had L flank pain for about a week as well. Denies n/v, fever, blood in stool or urine, cough, SOB. No contact with known COVID+ individual. No loss of sense of taste or smell. Endorses some mild diarrhea but denies other symptoms.   Denies hx of HTN or issues with his kidneys.   ED evaluate notable for HTN in vitals but otherwise satting well on room air, neg COVID PCR, BMP w Cr 1.42 and normal WBC, CT Renal stone showing normal abdomen/pelvis findings but multiple GGO in bilateral lower lung fields suspicious for atypival vs viral PNA.    Review of Systems:  12 points ROS conducted and otherwise negative except as noted in HPI  Past Medical History:  Diagnosis Date  . Back pain, chronic    History reviewed. No pertinent surgical history. Social History:  reports that he has quit smoking. He has never used smokeless tobacco. He reports current alcohol use. He reports that he does not use drugs.  Allergies  Allergen Reactions  . Tylenol [Acetaminophen] Itching    History reviewed. No pertinent family history.   Prior to Admission medications   Medication Sig Start Date End Date Taking? Authorizing Provider  aspirin 325 MG tablet Take 325 mg by mouth daily.   Yes [provider]  fentaNYL (DURAGESIC) 75 MCG/HR Place 1 patch onto the skin every 3 (three) days.   Yes [provider]  gabapentin (NEURONTIN) 300 MG capsule Take 300 mg by mouth 3 (three) times daily.   Yes [provider]  MELATONIN PO Take 1 tablet by mouth at bedtime.   Yes [provider]  mirtazapine  (REMERON) 15 MG tablet Take 15 mg by mouth at bedtime.   Yes [provider]  Multiple Vitamins-Minerals (ONE-A-DAY MENS 50+ PO) Take 1 tablet by mouth daily.   Yes [provider]  oxyCODONE (ROXICODONE) 15 MG immediate release tablet Take 15 mg by mouth in the morning, at noon, and at bedtime.   Yes [provider]  lidocaine (LIDODERM) 5 % Place 1 patch onto the skin daily. Remove & Discard patch within 12 hours or as directed by MD Patient not taking: Reported on 10/22/2019 03/03/19   Maudie Flakes, MD  naproxen (NAPROSYN) 500 MG tablet Take 1 tablet (500 mg total) by mouth 2 (two) times daily. Patient not taking: Reported on 10/22/2019 02/02/17   Ashley Murrain, NP  oxyCODONE (ROXICODONE) 5 MG immediate release tablet Take 1 tablet (5 mg total) by mouth every 6 (six) hours as needed for severe pain. Patient not taking: Reported on 10/22/2019 08/28/13   Etta Quill, NP  predniSONE (DELTASONE) 10 MG tablet Take 2 tablets (20 mg total) by mouth daily. Patient not taking: Reported on 10/22/2019 08/28/13   Etta Quill, NP   Physical Exam: Vitals:   10/22/19 2030 10/22/19 2045 10/23/19 0004 10/23/19 0005  BP: (!) 128/97 (!) 150/91  (!) 154/92  Pulse: (!) 117 (!) 107  96  Resp:    20  Temp:    99.3 F (37.4 C)  TempSrc:    Oral  SpO2:  99% 99%  97%  Weight:   81 kg   Height:   6' (1.829 m)     Wt Readings from Last 3 Encounters:  10/23/19 81 kg  03/03/19 83.9 kg    General:  Appears calm and comfortable Eyes: PERRL, normal lids, irises & conjunctiva ENT: grossly normal hearing, lips & tongue Neck: no LAD, masses or thyromegaly Cardiovascular: RRR, no m/r/g. No LE edema. Telemetry: SR, no arrhythmias  Respiratory: CTA bilaterally, no w/r/r. Normal respiratory effort. Abdomen: soft, ntnd Skin: no rash or induration seen on limited exam Musculoskeletal: grossly normal tone BUE/BLE Psychiatric: grossly normal mood and affect, speech fluent and appropriate  Neurologic: grossly non-focal.          Labs on Admission:  Basic Metabolic Panel: Recent Labs  Lab 10/22/19 1515  NA 139  K 3.7  CL 105  CO2 19*  GLUCOSE 108*  BUN 17  CREATININE 1.42*  CALCIUM 9.2   Liver Function Tests: No results for input(s): AST, ALT, ALKPHOS, BILITOT, PROT, ALBUMIN in the last 168 hours. No results for input(s): LIPASE, AMYLASE in the last 168 hours. No results for input(s): AMMONIA in the last 168 hours. CBC: Recent Labs  Lab 10/22/19 1515  WBC 5.7  HGB 15.0  HCT 45.6  MCV 83.1  PLT 390   Cardiac Enzymes: No results for input(s): CKTOTAL, CKMB, CKMBINDEX, TROPONINI in the last 168 hours.  BNP (last 3 results) No results for input(s): BNP in the last 8760 hours.  ProBNP (last 3 results) No results for input(s): PROBNP in the last 8760 hours.  CBG: No results for input(s): GLUCAP in the last 168 hours.  Radiological Exams on Admission: DG Chest Portable 1 View  Result Date: 10/22/2019 CLINICAL DATA:  Chest pain, shortness of breath, pulmonary opacities on CT abdomen pelvis EXAM: PORTABLE CHEST 1 VIEW COMPARISON:  Same day CT abdomen pelvis FINDINGS: The heart size and mediastinal contours are within normal limits. There are subtle heterogeneous airspace opacities of the peripheral lower lungs. The visualized skeletal structures are unremarkable. IMPRESSION: There are subtle heterogeneous airspace opacities of the peripheral lower lungs, better demonstrated by prior CT and nonspecific, infectious or inflammatory, differential considerations particularly including COVID-19 airspace disease if clinically suspected. Electronically Signed   By: Lauralyn Primes M.D.   On: 10/22/2019 20:27   CT Renal Stone Study  Result Date: 10/22/2019 CLINICAL DATA:  Left flank pain EXAM: CT ABDOMEN AND PELVIS WITHOUT CONTRAST TECHNIQUE: Multidetector CT imaging of the abdomen and pelvis was performed following the standard protocol without IV contrast. COMPARISON:   CT report 01/08/2003 FINDINGS: Lower chest: Lung bases demonstrate multifocal bilateral mostly peripheral ground-glass densities, most notable in the right lower lobe. No pleural effusion. Heart size within normal limits. Coronary vascular calcification Hepatobiliary: Probable focal fat infiltration near the falciform ligament. No calcified gallstone or biliary dilatation Pancreas: Unremarkable. No pancreatic ductal dilatation or surrounding inflammatory changes. Spleen: Normal in size without focal abnormality. Adrenals/Urinary Tract: Adrenal glands are unremarkable. Kidneys are normal, without renal calculi, focal lesion, or hydronephrosis. Bladder is unremarkable. Stomach/Bowel: The stomach is nonenlarged. No dilated small bowel. Negative appendix. No bowel wall thickening. Vascular/Lymphatic: Extensive aortic atherosclerosis. No aortic aneurysm. No adenopathy Reproductive: Prostate is unremarkable. Other: Negative for free air or free fluid. Musculoskeletal: Avascular necrosis of the bilateral femoral heads without femoral head collapse. IMPRESSION: 1. Negative for hydronephrosis or ureteral stone. 2. Multifocal ground-glass density at the right greater than left lung bases suspicious for multifocal pneumonia, particularly atypical  or viral pneumonia. 3. 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   On: 10/22/2019 19:45    EKG: Independently reviewed. NSR, no ischemic changes, unremarkable  Assessment/Plan Active Problems:   AKI (acute kidney injury) (HCC)   Abnormal CT scan   AVN (avascular necrosis of bone) (HCC)  #AKI #Poor appetite #Abnormal CT of lungs AKI likely prerenal secondary to poor PO intake, though given has no prior checks on file this may be his baseline. S/p 2L of NS in ED, recheck in AM. As to etiology COVID PCR is negative, given one week of symptoms makes this diagnosis unlikely. Likely has another viral  illness, ?adenovirus? Given predominance of GI symptoms. Unlikely bacterial given lack of fever or other systemic symptoms and imaging findings, defer abx therapy at this time.  - repeat BMP in AM - IV hydration PRN  #HTN Incidentally noted since arrival and mild, no medication initiated, recommend follow up as outpatient.   #AVN of bilateral femoral heads Noted incidentally on CT imaging, recommend outpt Ortho follow up.   #Known Medical problems Chronic back pain on opioids - PMP reviewed, cont home fentnyl patch/oxycodone TID, gabapentin 300mg  TID   Code Status: Full code presumed DVT Prophylaxis: Lovenox Family Communication: not updated Disposition Plan: obs, likely d/c in AM  Time spent: 50 min  [facility]

## 2019-10-22 NOTE — ED Provider Notes (Signed)
[facility] EMERGENCY DEPARTMENT Provider Note   CSN: [id number] Arrival date & time: 10/22/19  1446     History CC: poor appetite  Ian Montoya is a 64 y.o. male presenting with poor appetite, diarrhea, for 7 days.  Patient reports that he has not felt like eating much for the past 7 days.  Reports he set up to 2 loose bowel movements per day, which are nonbloody.  He denies any nausea or vomiting and currently feels hungry.  He denies any acute abdominal pain.  He does report having some intermittent sharp lower left-sided abdominal pains, does not currently having any issues.  He denies any dysuria or hematuria.  He denies any fevers or chills.  He denies any cough congestion or respiratory symptoms.  He denies any abdominal surgical history.  HPI     Past Medical History:  Diagnosis Date  . Back pain, chronic   . Depression     Patient Active Problem List   Diagnosis Date Noted  . Abnormal CT scan 10/23/2019  . AVN (avascular necrosis of bone) (HCC) 10/23/2019  . Depression   . AKI (acute kidney injury) (HCC) 10/22/2019    History reviewed. No pertinent surgical history.     History reviewed. No pertinent family history.  Social History   Tobacco Use  . Smoking status: Former Games developer  . Smokeless tobacco: Never Used  Substance Use Topics  . Alcohol use: Yes    Comment: occ  . Drug use: No    Home Medications Prior to Admission medications   Medication Sig Start Date End Date Taking? Authorizing Provider  aspirin 325 MG tablet Take 325 mg by mouth daily.   Yes [provider]  fentaNYL (DURAGESIC) 75 MCG/HR Place 1 patch onto the skin every 3 (three) days.   Yes [provider]  gabapentin (NEURONTIN) 300 MG capsule Take 300 mg by mouth 3 (three) times daily.   Yes [provider]  MELATONIN PO Take 1 tablet by mouth at bedtime.   Yes [provider]  mirtazapine (REMERON) 15 MG tablet Take 15 mg by mouth  at bedtime.   Yes [provider]  Multiple Vitamins-Minerals (ONE-A-DAY MENS 50+ PO) Take 1 tablet by mouth daily.   Yes [provider]  oxyCODONE (ROXICODONE) 15 MG immediate release tablet Take 15 mg by mouth in the morning, at noon, and at bedtime.   Yes [provider]  lidocaine (LIDODERM) 5 % Place 1 patch onto the skin daily. Remove & Discard patch within 12 hours or as directed by MD Patient not taking: Reported on 10/22/2019 03/03/19   Sabas Sous, MD  naproxen (NAPROSYN) 500 MG tablet Take 1 tablet (500 mg total) by mouth 2 (two) times daily. Patient not taking: Reported on 10/22/2019 02/02/17   Janne Napoleon, NP  oxyCODONE (ROXICODONE) 5 MG immediate release tablet Take 1 tablet (5 mg total) by mouth every 6 (six) hours as needed for severe pain. Patient not taking: Reported on 10/22/2019 08/28/13   Felicie Morn, NP  predniSONE (DELTASONE) 10 MG tablet Take 2 tablets (20 mg total) by mouth daily. Patient not taking: Reported on 10/22/2019 08/28/13   Felicie Morn, NP    Allergies    Tylenol [acetaminophen]  Review of Systems   Review of Systems  Constitutional: Positive for fatigue. Negative for chills and fever.  Respiratory: Negative for cough and shortness of breath.   Cardiovascular: Negative for chest pain and palpitations.  Gastrointestinal:  Positive for abdominal pain and diarrhea. Negative for blood in stool, nausea and vomiting.  Genitourinary: Negative for difficulty urinating and frequency.  Skin: Negative for color change and rash.  Neurological: Negative for syncope and headaches.  Psychiatric/Behavioral: Negative for agitation and confusion.  All other systems reviewed and are negative.   Physical Exam Updated Vital Signs BP 134/90 (BP Location: Left Arm)   Pulse 93   Temp 99.7 F (37.6 C) (Oral)   Resp 16   Ht 6' (1.829 m)   Wt 81 kg   SpO2 97%   BMI 24.22 kg/m   Physical Exam Vitals and nursing note reviewed.    Constitutional:      Appearance: He is well-developed.  HENT:     Head: Normocephalic and atraumatic.     Comments: Tachy mucous membranes Eyes:     Conjunctiva/sclera: Conjunctivae normal.  Cardiovascular:     Rate and Rhythm: Regular rhythm. Tachycardia present.     Pulses: Normal pulses.  Pulmonary:     Effort: Pulmonary effort is normal. No respiratory distress.     Breath sounds: Normal breath sounds.  Abdominal:     General: There is no distension.     Palpations: Abdomen is soft.     Tenderness: There is no abdominal tenderness. There is no right CVA tenderness, left CVA tenderness or guarding. Negative signs include Murphy's sign.  Musculoskeletal:     Cervical back: Neck supple.  Skin:    General: Skin is warm and dry.  Neurological:     Mental Status: He is alert.  Psychiatric:        Mood and Affect: Mood normal.        Behavior: Behavior normal.     ED Results / Procedures / Treatments   Labs (all labs ordered are listed, but only abnormal results are displayed) Labs Reviewed  BASIC METABOLIC PANEL - Abnormal; Notable for the following components:      Result Value   CO2 19 (*)    Glucose, Bld 108 (*)    Creatinine, Ser 1.42 (*)    GFR calc non Af Amer 51 (*)    GFR calc Af Amer 60 (*)    All other components within normal limits  URINALYSIS, ROUTINE W REFLEX MICROSCOPIC - Abnormal; Notable for the following components:   APPearance CLOUDY (*)    Specific Gravity, Urine >1.030 (*)    Hgb urine dipstick LARGE (*)    Bilirubin Urine SMALL (*)    Ketones, ur 15 (*)    Protein, ur >300 (*)    All other components within normal limits  URINALYSIS, MICROSCOPIC (REFLEX) - Abnormal; Notable for the following components:   Bacteria, UA FEW (*)    All other components within normal limits  BASIC METABOLIC PANEL - Abnormal; Notable for the following components:   Glucose, Bld 100 (*)    Creatinine, Ser 1.32 (*)    Calcium 8.1 (*)    GFR calc non Af Amer 56  (*)    All other components within normal limits  CBC - Abnormal; Notable for the following components:   Hemoglobin 12.5 (*)    HCT 38.5 (*)    All other components within normal limits  RESPIRATORY PANEL BY RT PCR (FLU A&B, COVID)  CBC  HIV ANTIBODY (ROUTINE TESTING W REFLEX)  TSH  POC SARS CORONAVIRUS 2 AG -  ED    EKG EKG Interpretation  Date/Time:  Monday October 22 2019 15:07:57 EST Ventricular Rate:  116 PR Interval:  122 QRS Duration: 82 QT Interval:  334 QTC Calculation: 464 R Axis:   58 Text Interpretation: Sinus tachycardia Otherwise normal ECG No STEMI Confirmed by Alvester Chou [phone]) on 10/22/2019 6:12:30 PM   Radiology DG Chest Portable 1 View  Result Date: 10/22/2019 CLINICAL DATA:  Chest pain, shortness of breath, pulmonary opacities on CT abdomen pelvis EXAM: PORTABLE CHEST 1 VIEW COMPARISON:  Same day CT abdomen pelvis FINDINGS: The heart size and mediastinal contours are within normal limits. There are subtle heterogeneous airspace opacities of the peripheral lower lungs. The visualized skeletal structures are unremarkable. IMPRESSION: There are subtle heterogeneous airspace opacities of the peripheral lower lungs, better demonstrated by prior CT and nonspecific, infectious or inflammatory, differential considerations particularly including COVID-19 airspace disease if clinically suspected. Electronically Signed   By: Lauralyn Primes M.D.   On: 10/22/2019 20:27   CT Renal Stone Study  Result Date: 10/22/2019 CLINICAL DATA:  Left flank pain EXAM: CT ABDOMEN AND PELVIS WITHOUT CONTRAST TECHNIQUE: Multidetector CT imaging of the abdomen and pelvis was performed following the standard protocol without IV contrast. COMPARISON:  CT report 01/08/2003 FINDINGS: Lower chest: Lung bases demonstrate multifocal bilateral mostly peripheral ground-glass densities, most notable in the right lower lobe. No pleural effusion. Heart size within normal limits. Coronary vascular  calcification Hepatobiliary: Probable focal fat infiltration near the falciform ligament. No calcified gallstone or biliary dilatation Pancreas: Unremarkable. No pancreatic ductal dilatation or surrounding inflammatory changes. Spleen: Normal in size without focal abnormality. Adrenals/Urinary Tract: Adrenal glands are unremarkable. Kidneys are normal, without renal calculi, focal lesion, or hydronephrosis. Bladder is unremarkable. Stomach/Bowel: The stomach is nonenlarged. No dilated small bowel. Negative appendix. No bowel wall thickening. Vascular/Lymphatic: Extensive aortic atherosclerosis. No aortic aneurysm. No adenopathy Reproductive: Prostate is unremarkable. Other: Negative for free air or free fluid. Musculoskeletal: Avascular necrosis of the bilateral femoral heads without femoral head collapse. IMPRESSION: 1. Negative for hydronephrosis or ureteral stone. 2. Multifocal ground-glass density at the right greater than left lung bases suspicious for multifocal pneumonia, particularly atypical or viral pneumonia. 3. 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   On: 10/22/2019 19:45    Procedures Procedures (including critical care time)  Medications Ordered in ED Medications  fentaNYL (DURAGESIC) 75 MCG/HR 1 patch (1 patch Transdermal Not Given 10/23/19 1020)  oxyCODONE (Oxy IR/ROXICODONE) immediate release tablet 15 mg (15 mg Oral Given 10/23/19 1019)  mirtazapine (REMERON) tablet 15 mg (15 mg Oral Given 10/23/19 0031)  Melatonin TABS 3 mg (3 mg Oral Not Given 10/23/19 0031)  gabapentin (NEURONTIN) capsule 300 mg (300 mg Oral Given 10/23/19 1019)  enoxaparin (LOVENOX) injection 40 mg (40 mg Subcutaneous Not Given 10/23/19 1021)  sodium chloride 0.9 % bolus 1,000 mL (0 mLs Intravenous Stopped 10/22/19 2151)  sodium chloride 0.9 % bolus 1,000 mL (0 mLs Intravenous Stopped 10/22/19 2313)    ED Course  I have reviewed the triage  vital signs and the nursing notes.  Pertinent labs & imaging results that were available during my care of the patient were reviewed by me and considered in my medical decision making (see chart for details).  64 yo male presenting to ED with poor appetite, minimal PO intake for approx 1 week.  Also reporting intermittent sharp left flank/left abdominal pain.  No known hx of kidney stones, and no reproducible abd or flank tenderness on exam.  We obtained a CT abdomen to evaluate for  kidney stones, and did not find any clear intraabdominal source of his symptoms.  The CT did not incidentally avascular necrosis of the bilateral femoral heads (no evidence of acute fracture on exam).  It also noted bilateral multifocal ground-glass densities, further seen on chest xray, suggestive of viral syndrome.  I suspect his symptoms of poor appetite may be related to covid -19.  Less likely bacterial PNA or sepsis based on his labwork and clinical exams  His UA shows signs of dehydration (high specific gravity, elevated ketones and protein), and his BMP shows a Cr of 1.42 without prior baseline, concerning for possible AKI.  I discussed with the patient whether he felt he could keep himself hydrated at home, if I offered nausea medications, but he does not think that he can.  We'll admit to the hospital for IV fluids as a PUI  Ian Montoya was evaluated in Emergency Department on 10/23/2019 for the symptoms described in the history of present illness. He was evaluated in the context of the global COVID-19 pandemic, which necessitated consideration that the patient might be at risk for infection with the SARS-CoV-2 virus that causes COVID-19. Institutional protocols and algorithms that pertain to the evaluation of patients at risk for COVID-19 are in a state of rapid change based on information released by regulatory bodies including the CDC and federal and state organizations. These policies and algorithms were  followed during the patient's care in the ED.   Clinical Course as of Oct 22 1108  Mon Oct 22, 2019  2040 Still not tolerating PO.  Given ketones in urine, Cr 1.42 with no prior baseline, suspect he may be dehydrated, will admit for IV fluids.  He remains PUI for COVID with his xray and ct lung findings.  No PCP in this area   [MT]  2111 Signout given to hospitalist   [MT]    Clinical Course User Index [MT] Terald Sleeper, MD    Final Clinical Impression(s) / ED Diagnoses Final diagnoses:  Poor appetite  Avascular necrosis of femoral head, unspecified laterality (HCC)  AKI (acute kidney injury) (HCC)    Rx / DC Orders ED Discharge Orders         Ordered    Increase activity slowly     10/23/19 1025    Diet - low sodium heart healthy     10/23/19 1025    Discharge instructions    Comments: Take medications as prescribed Follow up with PCP 2-3 weeks in [place] for evaluation of eating habits. Recommend BMET to track kidney function and hydration Follow up with primary psychiatrist in [place] in 1-2 weeks for evaluation of depression and impact on eating decisions   10/23/19 1025    Call MD for:  temperature >100.4     10/23/19 1025    Call MD for:  difficulty breathing, headache or visual disturbances     10/23/19 1025    Call MD for:  extreme fatigue     10/23/19 1025           Terald Sleeper, MD 10/23/19 1111

## 2019-10-23 ENCOUNTER — Encounter (HOSPITAL_COMMUNITY): Payer: Self-pay | Admitting: Family Medicine

## 2019-10-23 ENCOUNTER — Other Ambulatory Visit: Payer: Self-pay

## 2019-10-23 DIAGNOSIS — F329 Major depressive disorder, single episode, unspecified: Secondary | ICD-10-CM | POA: Diagnosis present

## 2019-10-23 DIAGNOSIS — R9389 Abnormal findings on diagnostic imaging of other specified body structures: Secondary | ICD-10-CM

## 2019-10-23 DIAGNOSIS — N179 Acute kidney failure, unspecified: Secondary | ICD-10-CM | POA: Diagnosis not present

## 2019-10-23 DIAGNOSIS — F32A Depression, unspecified: Secondary | ICD-10-CM | POA: Diagnosis present

## 2019-10-23 DIAGNOSIS — M8700 Idiopathic aseptic necrosis of unspecified bone: Secondary | ICD-10-CM | POA: Diagnosis present

## 2019-10-23 LAB — BASIC METABOLIC PANEL
Anion gap: 12 (ref 5–15)
BUN: 13 mg/dL (ref 8–23)
CO2: 22 mmol/L (ref 22–32)
Calcium: 8.1 mg/dL — ABNORMAL LOW (ref 8.9–10.3)
Chloride: 105 mmol/L (ref 98–111)
Creatinine, Ser: 1.32 mg/dL — ABNORMAL HIGH (ref 0.61–1.24)
GFR calc Af Amer: >60 mL/min (ref >60)
GFR calc non Af Amer: 56 mL/min — ABNORMAL LOW (ref >60)
Glucose, Bld: 100 mg/dL — ABNORMAL HIGH (ref 70–99)
Potassium: 3.5 mmol/L (ref 3.5–5.1)
Sodium: 139 mmol/L (ref 135–145)

## 2019-10-23 LAB — CBC
HCT: 38.5 % — ABNORMAL LOW (ref 39.0–52.0)
Hemoglobin: 12.5 g/dL — ABNORMAL LOW (ref 13.0–17.0)
MCH: 27.2 pg (ref 26.0–34.0)
MCHC: 32.5 g/dL (ref 30.0–36.0)
MCV: 83.9 fL (ref 80.0–100.0)
Platelets: 306 10*3/uL (ref 150–400)
RBC: 4.59 MIL/uL (ref 4.22–5.81)
RDW: 15.2 % (ref 11.5–15.5)
WBC: 6.3 10*3/uL (ref 4.0–10.5)
nRBC: 0 % (ref 0.0–0.2)

## 2019-10-23 LAB — TSH: TSH: 2.833 u[IU]/mL (ref 0.350–4.500)

## 2019-10-23 LAB — HIV ANTIBODY (ROUTINE TESTING W REFLEX): HIV Screen 4th Generation wRfx: NONREACTIVE

## 2019-10-23 NOTE — Progress Notes (Signed)
New Admission Note:  Arrival Method: Wheelchair Mental Orientation: Alert and oriented x 4 Telemetry: Box 06  Assessment: Completed Skin: Warm and dry IV: NSL Pain:Denies Tubes: N/A Safety Measures: Safety Fall Prevention Plan initiated.  Admission: Completed 5 M  Orientation: Patient has been orientated to the room, unit and the staff. Welcome booklet given.  Family: None  Orders have been reviewed and implemented. Will continue to monitor the patient. Call light has been placed within reach and bed alarm has been activated.   Guilford Shi BSN, RN  Phone Number: [phone]

## 2019-10-23 NOTE — Discharge Summary (Signed)
Physician Discharge Summary  Ian Montoya NOM:[id number] DOB: Apr 22, 1956 DOA: 10/22/2019  PCP: Patient, No Pcp Per  Admit date: 10/22/2019 Discharge date: 10/23/2019  Admitted From: home Discharge disposition: home   Recommendations for Outpatient Follow-Up:   1. Follow up with PCP 2-3 weeks for evaluation of kidney function, blood pressure, eating habits. Also recommend follow up with orthopedics OP for AVN bilateral femoral heads 2. Follow up with primary psychiatry in 1-2 weeks for evaluation of depression and impact on eating decisions   Discharge Diagnosis:   Principal Problem:   AKI (acute kidney injury) (HCC) Active Problems:   Abnormal CT scan   AVN (avascular necrosis of bone) (HCC)   Depression    Discharge Condition: Improved.  Diet recommendation: Low sodium, heart healthy.    Wound care: None.  Code status: Full.   History of Present Illness:   Ian Montoya is a 64 y.o. male with hx of chronic back pain on chronic opioids who presented 2/15 for poor appetite. Reported  not been able to eat for about a week. Has also had L flank pain for about a week as well. Denied n/v, fever, blood in stool or urine, cough, SOB. No contact with known COVID+ individual. No loss of sense of taste or smell. Endorsed some mild diarrhea but denied other symptoms. Denie hx of HTN or issues with his kidneys.  Work-up reveals hypertension and acute kidney injury.   Hospital Course by Problem:   #1.  Acute kidney injury.  Patient received IV fluids.  Suspect related to his not eating.  He denies ever being told he had chronic kidney disease.  I suspect he has hypertension that is led to some chronic kidney disease.  His primary care physicians are in [place] at the [place] and the only one he sees with any regularity as his pain specialist.  CT negative for hydronephrosis or ureteral stone.  Creatinine trending down this morning at 1.3.  Will discharge have encouraged patient to  follow-up with his primary care provider for evaluation of chronic kidney disease and possible hypertension.  #2.  Poor appetite.  Patient denies poor appetite but states "there is just nothing I want to eat".  He reports he feels hungry but just does not like many foods.  He denies abdominal pain nausea vomiting when he does eat.  He endorses that he is deciding not to eat as he is not pleased with choices.  He also endorses some depression that he believes may be impacting his decision to not eat.  He acknowledges the need for food to sustain life.  He denies suicidal ideation or wish to harm himself.  Suggested a behavioral health consult which he refused as he prefers to see his psychiatrist in [place].  Recommended he follow-up within the week  #3.  Hypertension.  Blood pressure is at the high end of normal.  He denies ever being diagnosed with high blood pressure.  He states he does not go to his primary care provider very often.  At discharge his blood pressure is 134/90.  Recommend close outpatient follow-up with his primary care provider for evaluation of blood pressure control  #4.  Abnormal CT.  Renal CT revealed lung bases demonstrate multifocal bilateral mostly peripheral groundglass densities more in the right lower lobe.  He denies fever chills nausea vomiting cough diarrhea.  He is afebrile hemodynamically stable and not hypoxic.  Prefers outpatient [facility]  #5. AVN of bilateral femoral heads.  Incidental finding on CT.  Denies pain or difficulty with gait.  He does report chronic back pain for which he sees his pain specialist.  Recommend close outpatient follow-up with orthopedics.  #6.  Chronic back pain.  Stable at baseline.  He denied any worsening of this chronic pain.  Continue his home regimen and follow-up with his pain specialist   Medical Consultants:      Discharge Exam:   Vitals:   10/23/19 0504 10/23/19 0913  BP: (!)  157/99 134/90  Pulse: 87 93  Resp: 20 16  Temp: 99.9 F (37.7 C) 99.7 F (37.6 C)  SpO2: 97% 97%   Vitals:   10/23/19 0004 10/23/19 0005 10/23/19 0504 10/23/19 0913  BP:  (!) 154/92 (!) 157/99 134/90  Pulse:  96 87 93  Resp:  20 20 16   Temp:  99.3 F (37.4 C) 99.9 F (37.7 C) 99.7 F (37.6 C)  TempSrc:  Oral Oral Oral  SpO2:  97% 97% 97%  Weight: 81 kg     Height: 6' (1.829 m)       General exam: Appears calm and comfortable.  Respiratory system: Clear to auscultation. Respiratory effort normal. Cardiovascular system: S1 & S2 heard, RRR. No JVD,  rubs, gallops or clicks. No murmurs. Gastrointestinal system: Abdomen is nondistended, soft and nontender. No organomegaly or masses felt. Normal bowel sounds heard. Central nervous system: Alert and oriented. No focal neurological deficits. Extremities: No clubbing,  or cyanosis. No edema. Skin: No rashes, lesions or ulcers. Psychiatry: Judgement and insight appear normal. Mood & affect appropriate.    The results of significant diagnostics from this hospitalization (including imaging, microbiology, ancillary and laboratory) are listed below for reference.     Procedures and Diagnostic Studies:   DG Chest Portable 1 View  Result Date: 10/22/2019 CLINICAL DATA:  Chest pain, shortness of breath, pulmonary opacities on CT abdomen pelvis EXAM: PORTABLE CHEST 1 VIEW COMPARISON:  Same day CT abdomen pelvis FINDINGS: The heart size and mediastinal contours are within normal limits. There are subtle heterogeneous airspace opacities of the peripheral lower lungs. The visualized skeletal structures are unremarkable. IMPRESSION: There are subtle heterogeneous airspace opacities of the peripheral lower lungs, better demonstrated by prior CT and nonspecific, infectious or inflammatory, differential considerations particularly including COVID-19 airspace disease if clinically suspected. Electronically Signed   By: Eddie Candle M.D.   On:  10/22/2019 20:27   CT Renal Stone Study  Result Date: 10/22/2019 CLINICAL DATA:  Left flank pain EXAM: CT ABDOMEN AND PELVIS WITHOUT CONTRAST TECHNIQUE: Multidetector CT imaging of the abdomen and pelvis was performed following the standard protocol without IV contrast. COMPARISON:  CT report 01/08/2003 FINDINGS: Lower chest: Lung bases demonstrate multifocal bilateral mostly peripheral ground-glass densities, most notable in the right lower lobe. No pleural effusion. Heart size within normal limits. Coronary vascular calcification Hepatobiliary: Probable focal fat infiltration near the falciform ligament. No calcified gallstone or biliary dilatation Pancreas: Unremarkable. No pancreatic ductal dilatation or surrounding inflammatory changes. Spleen: Normal in size without focal abnormality. Adrenals/Urinary Tract: Adrenal glands are unremarkable. Kidneys are normal, without renal calculi, focal lesion, or hydronephrosis. Bladder is unremarkable. Stomach/Bowel: The stomach is nonenlarged. No dilated small bowel. Negative appendix. No bowel wall thickening. Vascular/Lymphatic: Extensive aortic atherosclerosis. No aortic aneurysm. No adenopathy Reproductive: Prostate is unremarkable. Other: Negative for free air or free fluid. Musculoskeletal: Avascular necrosis of the bilateral femoral heads without femoral head collapse. IMPRESSION: 1. Negative for  hydronephrosis or ureteral stone. 2. Multifocal ground-glass density at the right greater than left lung bases suspicious for multifocal pneumonia, particularly atypical or viral pneumonia. 3. 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   On: 10/22/2019 19:45     Labs:   Basic Metabolic Panel: Recent Labs  Lab 10/22/19 1515 10/23/19 0501  NA 139 139  K 3.7 3.5  CL 105 105  CO2 19* 22  GLUCOSE 108* 100*  BUN 17 13  CREATININE 1.42* 1.32*  CALCIUM 9.2 8.1*   GFR Estimated  Creatinine Clearance: 61.2 mL/min (A) (by C-G formula based on SCr of 1.32 mg/dL (H)). Liver Function Tests: No results for input(s): AST, ALT, ALKPHOS, BILITOT, PROT, ALBUMIN in the last 168 hours. No results for input(s): LIPASE, AMYLASE in the last 168 hours. No results for input(s): AMMONIA in the last 168 hours. Coagulation profile No results for input(s): INR, PROTIME in the last 168 hours.  CBC: Recent Labs  Lab 10/22/19 1515 10/23/19 0501  WBC 5.7 6.3  HGB 15.0 12.5*  HCT 45.6 38.5*  MCV 83.1 83.9  PLT 390 306   Cardiac Enzymes: No results for input(s): CKTOTAL, CKMB, CKMBINDEX, TROPONINI in the last 168 hours. BNP: Invalid input(s): POCBNP CBG: No results for input(s): GLUCAP in the last 168 hours. D-Dimer No results for input(s): DDIMER in the last 72 hours. Hgb A1c No results for input(s): HGBA1C in the last 72 hours. Lipid Profile No results for input(s): CHOL, HDL, LDLCALC, TRIG, CHOLHDL, LDLDIRECT in the last 72 hours. Thyroid function studies Recent Labs    10/23/19 0853  TSH 2.833   Anemia work up No results for input(s): VITAMINB12, FOLATE, FERRITIN, TIBC, IRON, RETICCTPCT in the last 72 hours. Microbiology Recent Results (from the past 240 hour(s))  Respiratory Panel by RT PCR (Flu A&B, Covid) - Nasopharyngeal Swab     Status: None   Collection Time: 10/22/19  8:52 PM   Specimen: Nasopharyngeal Swab  Result Value Ref Range Status   SARS Coronavirus 2 by RT PCR NEGATIVE NEGATIVE Final    Comment: (NOTE) SARS-CoV-2 target nucleic acids are NOT DETECTED. The SARS-CoV-2 RNA is generally detectable in upper respiratoy specimens during the acute phase of infection. The lowest concentration of SARS-CoV-2 viral copies this assay can detect is 131 copies/mL. A negative result does not preclude SARS-Cov-2 infection and should not be used as the sole basis for treatment or other patient management decisions. A negative result may occur with  improper  specimen collection/handling, submission of specimen other than nasopharyngeal swab, presence of viral mutation(s) within the areas targeted by this assay, and inadequate number of viral copies (<131 copies/mL). A negative result must be combined with clinical observations, patient history, and epidemiological information. The expected result is Negative. Fact Sheet for Patients:  https://www.moore.com/ Fact Sheet for Healthcare Providers:  https://www.young.biz/ This test is not yet ap proved or cleared by the [place] FDA and  has been authorized for detection and/or diagnosis of SARS-CoV-2 by FDA under an Emergency Use Authorization (EUA). This EUA will remain  in effect (meaning this test can be used) for the duration of the COVID-19 declaration under Section 564(b)(1) of the Act, 21 U.S.C. section 360bbb-3(b)(1), unless the authorization is terminated or revoked sooner.    Influenza A by PCR NEGATIVE NEGATIVE Final   Influenza B by PCR NEGATIVE NEGATIVE Final    Comment: (NOTE) The Xpert Xpress SARS-CoV-2/FLU/RSV assay is intended  as an aid in  the diagnosis of influenza from Nasopharyngeal swab specimens and  should not be used as a sole basis for treatment. Nasal washings and  aspirates are unacceptable for Xpert Xpress SARS-CoV-2/FLU/RSV  testing. Fact Sheet for Patients: https://www.moore.com/ Fact Sheet for Healthcare Providers: https://www.young.biz/ This test is not yet approved or cleared by the [place] FDA and  has been authorized for detection and/or diagnosis of SARS-CoV-2 by  FDA under an Emergency Use Authorization (EUA). This EUA will remain  in effect (meaning this test can be used) for the duration of the  Covid-19 declaration under Section 564(b)(1) of the Act, 21  U.S.C. section 360bbb-3(b)(1), unless the authorization is  terminated or revoked. Performed at [facility], [address] [place], [place] [address]      Discharge Instructions:   Discharge Instructions    Call MD for:  difficulty breathing, headache or visual disturbances   Complete by: As directed    Call MD for:  extreme fatigue   Complete by: As directed    Call MD for:  temperature >100.4   Complete by: As directed    Diet - low sodium heart healthy   Complete by: As directed    Discharge instructions   Complete by: As directed    Take medications as prescribed Follow up with PCP 2-3 weeks in [place] for evaluation of eating habits. Recommend BMET to track kidney function and hydration Follow up with primary psychiatrist in [place] in 1-2 weeks for evaluation of depression and impact on eating decisions   Increase activity slowly   Complete by: As directed      Allergies as of 10/23/2019      Reactions   Tylenol [acetaminophen] Itching      Medication List    STOP taking these medications   lidocaine 5 % Commonly known as: Lidoderm   naproxen 500 MG tablet Commonly known as: NAPROSYN   predniSONE 10 MG tablet Commonly known as: DELTASONE     TAKE these medications   aspirin 325 MG tablet Take 325 mg by mouth daily.   fentaNYL 75 MCG/HR Commonly known as: DURAGESIC Place 1 patch onto the skin every 3 (three) days.   gabapentin 300 MG capsule Commonly known as: NEURONTIN Take 300 mg by mouth 3 (three) times daily.   MELATONIN PO Take 1 tablet by mouth at bedtime.   mirtazapine 15 MG tablet Commonly known as: REMERON Take 15 mg by mouth at bedtime.   ONE-A-DAY MENS 50+ PO Take 1 tablet by mouth daily.   oxyCODONE 15 MG immediate release tablet Commonly known as: ROXICODONE Take 15 mg by mouth in the morning, at noon, and at bedtime. What changed: Another medication with the same name was removed. Continue taking this medication, and follow the directions you see here.         Time coordinating discharge: 45  minutes  Signed:  Gwenyth Bender NP  [facility] 10/23/2019, 10:32 AM

## 2019-10-23 NOTE — Progress Notes (Signed)
DISCHARGE NOTE HOME Ian Montoya to be discharged Home per MD order. Discussed prescriptions and follow up appointments with the patient. Prescriptions given to patient; medication list explained in detail. Patient verbalized understanding.  Skin clean, dry and intact without evidence of skin break down, no evidence of skin tears noted. IV catheter discontinued intact. Site without signs and symptoms of complications. Dressing and pressure applied. Pt denies pain at the site currently. No complaints noted.  Patient free of lines, drains, and wounds.   An After Visit Summary (AVS) was printed and given to the patient. Patient escorted via wheelchair, and discharged home via private auto.  Margretta Sidle, RN3

## 2019-12-02 ENCOUNTER — Encounter (HOSPITAL_COMMUNITY): Payer: Self-pay | Admitting: Emergency Medicine

## 2019-12-02 ENCOUNTER — Other Ambulatory Visit: Payer: Self-pay

## 2019-12-02 ENCOUNTER — Emergency Department (HOSPITAL_COMMUNITY)
Admission: EM | Admit: 2019-12-02 | Discharge: 2019-12-02 | Disposition: A | Discharge status: Home / Self Care | Payer: No Typology Code available for payment source | Attending: Emergency Medicine | Admitting: Emergency Medicine

## 2019-12-02 ENCOUNTER — Emergency Department (HOSPITAL_COMMUNITY): Payer: No Typology Code available for payment source

## 2019-12-02 DIAGNOSIS — Z87891 Personal history of nicotine dependence: Secondary | ICD-10-CM | POA: Insufficient documentation

## 2019-12-02 DIAGNOSIS — R531 Weakness: Secondary | ICD-10-CM | POA: Diagnosis present

## 2019-12-02 DIAGNOSIS — D649 Anemia, unspecified: Secondary | ICD-10-CM | POA: Diagnosis not present

## 2019-12-02 DIAGNOSIS — G253 Myoclonus: Secondary | ICD-10-CM

## 2019-12-02 DIAGNOSIS — Z79899 Other long term (current) drug therapy: Secondary | ICD-10-CM | POA: Insufficient documentation

## 2019-12-02 DIAGNOSIS — Z7982 Long term (current) use of aspirin: Secondary | ICD-10-CM | POA: Diagnosis not present

## 2019-12-02 LAB — URINALYSIS, ROUTINE W REFLEX MICROSCOPIC
Bacteria, UA: NONE SEEN
Bilirubin Urine: NEGATIVE
Glucose, UA: NEGATIVE mg/dL
Ketones, ur: NEGATIVE mg/dL
Leukocytes,Ua: NEGATIVE
Nitrite: NEGATIVE
Protein, ur: NEGATIVE mg/dL
Specific Gravity, Urine: 1.008 (ref 1.005–1.030)
pH: 6 (ref 5.0–8.0)

## 2019-12-02 LAB — CBC
HCT: 34.4 % — ABNORMAL LOW (ref 39.0–52.0)
Hemoglobin: 10.7 g/dL — ABNORMAL LOW (ref 13.0–17.0)
MCH: 27.1 pg (ref 26.0–34.0)
MCHC: 31.1 g/dL (ref 30.0–36.0)
MCV: 87.1 fL (ref 80.0–100.0)
Platelets: 419 10*3/uL — ABNORMAL HIGH (ref 150–400)
RBC: 3.95 MIL/uL — ABNORMAL LOW (ref 4.22–5.81)
RDW: 17.1 % — ABNORMAL HIGH (ref 11.5–15.5)
WBC: 8.2 10*3/uL (ref 4.0–10.5)
nRBC: 0 % (ref 0.0–0.2)

## 2019-12-02 LAB — HEPATIC FUNCTION PANEL
ALT: 19 U/L (ref 0–44)
AST: 18 U/L (ref 15–41)
Albumin: 3.1 g/dL — ABNORMAL LOW (ref 3.5–5.0)
Alkaline Phosphatase: 68 U/L (ref 38–126)
Bilirubin, Direct: <0.1 mg/dL (ref 0.0–0.2)
Total Bilirubin: 0.6 mg/dL (ref 0.3–1.2)
Total Protein: 6.7 g/dL (ref 6.5–8.1)

## 2019-12-02 LAB — BASIC METABOLIC PANEL
Anion gap: 8 (ref 5–15)
BUN: 13 mg/dL (ref 8–23)
CO2: 26 mmol/L (ref 22–32)
Calcium: 9 mg/dL (ref 8.9–10.3)
Chloride: 105 mmol/L (ref 98–111)
Creatinine, Ser: 1.11 mg/dL (ref 0.61–1.24)
GFR calc Af Amer: >60 mL/min (ref >60)
GFR calc non Af Amer: >60 mL/min (ref >60)
Glucose, Bld: 146 mg/dL — ABNORMAL HIGH (ref 70–99)
Potassium: 4.6 mmol/L (ref 3.5–5.1)
Sodium: 139 mmol/L (ref 135–145)

## 2019-12-02 LAB — CBG MONITORING, ED: Glucose-Capillary: 148 mg/dL — ABNORMAL HIGH (ref 70–99)

## 2019-12-02 MED ORDER — SODIUM CHLORIDE 0.9% FLUSH: 3.0000 mL | Freq: Once | INTRAVENOUS | Status: DC

## 2019-12-02 NOTE — ED Provider Notes (Signed)
[facility] EMERGENCY DEPARTMENT Provider Note   CSN: [id number] Arrival date & time: 12/02/19  1041     History Chief Complaint  Patient presents with  . Weakness    Ian Montoya is a 64 y.o. male.  HPI   64 year old male history of chronic back pain presents today complaining of generalized weakness.  He states that he woke up this morning and had some nausea which then resolved.  He did not vomit.  He has felt generally weak.  He denies any lateralized weakness.  He does take oxycodone and fentanyl patches as needed for his back pain.  He denies any changes in his medications or missed doses.  He is also on Neurontin.  He denies any headache, head injury, neck pain, chest pain, dyspnea, abdominal pain, vomiting, or diarrhea.  He denies any urinary tract infection symptoms.  He has not had similar symptoms in the past. Past Medical History:  Diagnosis Date  . Back pain, chronic   . Depression     Patient Active Problem List   Diagnosis Date Noted  . Abnormal CT scan 10/23/2019  . AVN (avascular necrosis of bone) (HCC) 10/23/2019  . Depression   . AKI (acute kidney injury) (HCC) 10/22/2019    History reviewed. No pertinent surgical history.     History reviewed. No pertinent family history.  Social History   Tobacco Use  . Smoking status: Former Games developer  . Smokeless tobacco: Never Used  Substance Use Topics  . Alcohol use: Yes    Comment: occ  . Drug use: No    Home Medications Prior to Admission medications   Medication Sig Start Date End Date Taking? Authorizing Provider  aspirin 325 MG tablet Take 325 mg by mouth daily.    [provider]  fentaNYL (DURAGESIC) 75 MCG/HR Place 1 patch onto the skin every 3 (three) days.    [provider]  gabapentin (NEURONTIN) 300 MG capsule Take 300 mg by mouth 3 (three) times daily.    [provider]  MELATONIN PO Take 1 tablet by mouth at bedtime.    [provider]  mirtazapine (REMERON) 15 MG tablet Take 15 mg by mouth at bedtime.    [provider]  Multiple Vitamins-Minerals (ONE-A-DAY MENS 50+ PO) Take 1 tablet by mouth daily.    [provider]  oxyCODONE (ROXICODONE) 15 MG immediate release tablet Take 15 mg by mouth in the morning, at noon, and at bedtime.    [provider]    Allergies    Tylenol [acetaminophen]  Review of Systems   Review of Systems  All other systems reviewed and are negative.   Physical Exam Updated Vital Signs BP 117/81 (BP Location: Right Arm)   Pulse 73   Temp 98 F (36.7 C) (Oral)   Resp 18   Ht 1.829 m (6')   Wt 83 kg   SpO2 98%   BMI 24.82 kg/m   Physical Exam Vitals and nursing note reviewed.  Constitutional:      Appearance: He is well-developed.  HENT:     Head: Normocephalic and atraumatic.     Right Ear: External ear normal.     Left Ear: External ear normal.     Nose: Nose normal.  Eyes:     Conjunctiva/sclera: Conjunctivae normal.     Pupils: Pupils are equal, round, and reactive to light.  Cardiovascular:     Rate and Rhythm: Normal rate and regular rhythm.  Heart sounds: Normal heart sounds.  Pulmonary:     Effort: Pulmonary effort is normal. No respiratory distress.     Breath sounds: Normal breath sounds. No wheezing.  Chest:     Chest wall: No tenderness.  Abdominal:     General: Bowel sounds are normal. There is no distension.     Palpations: Abdomen is soft. There is no mass.     Tenderness: There is no abdominal tenderness. There is no guarding.  Musculoskeletal:        General: Normal range of motion.     Cervical back: Normal range of motion and neck supple.  Skin:    General: Skin is warm and dry.     Capillary Refill: Capillary refill takes less than 2 seconds.  Neurological:     Mental Status: He is alert and oriented to person, place, and time.     Motor: No abnormal muscle tone.     Coordination: Coordination normal.     Deep  Tendon Reflexes: Reflexes are normal and symmetric.     Comments: Patient without lateralized palmar drift however he does have some drift independently of each arm which she is in jerks back up. He has no leg drift His elbow extensors/flexors are equal bilaterally Knee, ankle, hip flexors and extensors are equal bilaterally Sharp sensation intact bilateral face, arms, and feet  Psychiatric:        Behavior: Behavior normal.        Thought Content: Thought content normal.        Judgment: Judgment normal.     ED Results / Procedures / Treatments   Labs (all labs ordered are listed, but only abnormal results are displayed) Labs Reviewed  BASIC METABOLIC PANEL - Abnormal; Notable for the following components:      Result Value   Glucose, Bld 146 (*)    All other components within normal limits  CBC - Abnormal; Notable for the following components:   RBC 3.95 (*)    Hemoglobin 10.7 (*)    HCT 34.4 (*)    RDW 17.1 (*)    Platelets 419 (*)    All other components within normal limits  CBG MONITORING, ED - Abnormal; Notable for the following components:   Glucose-Capillary 148 (*)    All other components within normal limits  URINALYSIS, ROUTINE W REFLEX MICROSCOPIC  HEPATIC FUNCTION PANEL    EKG EKG Interpretation  Date/Time:  Sunday December 02 2019 10:44:57 EDT Ventricular Rate:  73 PR Interval:  150 QRS Duration: 76 QT Interval:  396 QTC Calculation: 436 R Axis:   0 Text Interpretation: Normal sinus rhythm Minimal voltage criteria for LVH, may be normal variant ( R in aVL ) Borderline ECG Confirmed by Margarita Grizzle [phone]) on 12/02/2019 1:53:32 PM   Radiology CT Head Wo Contrast  Result Date: 12/02/2019 CLINICAL DATA:  Headache. EXAM: CT HEAD WITHOUT CONTRAST TECHNIQUE: Contiguous axial images were obtained from the base of the skull through the vertex without intravenous contrast. COMPARISON:  None. FINDINGS: Brain: No evidence of acute infarction, hemorrhage,  hydrocephalus, extra-axial collection or mass lesion/mass effect. Vascular: Unremarkable. Skull: Intact. Sinuses/Orbits: Mucosal thickening involving the ethmoid air cells. Mastoid air cells are unremarkable. Other: None. IMPRESSION: No acute intracranial process. Electronically Signed   By: Annia Belt M.D.   On: 12/02/2019 13:01    Procedures Procedures (including critical care time)  Medications Ordered in ED Medications  sodium chloride flush (NS) 0.9 % injection 3 mL (3 mLs Intravenous  Not Given 12/02/19 1120)    ED Course  I have reviewed the triage vital signs and the nursing notes.  Pertinent labs & imaging results that were available during my care of the patient were reviewed by me and considered in my medical decision making (see chart for details). 64 year old male presents today complaining of some generalized weakness this morning.  There were no lateralized deficits.  On his exam, he did have mild jerking Review of labs and head CT revealed no obvious source of this problem.  However, myoclonic jerking can be associated with gabapentin.  Patient is advised to decrease his gabapentin with a tapering dose and to follow-up with his primary care doctor. Clinical Course as of Dec 02 1411  Sun Dec 02, 2019  1411 All abs, head CT, and EKG personally reviewed by me   [DR]    Clinical Course User Index [DR] Pattricia Boss, MD   MDM Rules/Calculators/A&P                       Final Clinical Impression(s) / ED Diagnoses Final diagnoses:  Weakness  Myoclonic jerking  Anemia, unspecified type    Rx / DC Orders ED Discharge Orders    None       Pattricia Boss, MD 12/02/19 1418

## 2019-12-02 NOTE — Discharge Instructions (Signed)
Please decrease your gabapentin to 2 times a day for 3 days then 1 time a day for 3 days. Call your primary care doctor for follow-up on Monday If you continue to have any jerking or increased weakness please return the emergency department. You have mild anemia which should be followed up by your primary care doctor

## 2019-12-02 NOTE — ED Triage Notes (Signed)
Pt to triage via [facility] from home.  EMS reports pt woke up with body tremors and dizziness this morning.  Pt states he just felt strange when he woke up and reports generalized weakness.  He states he really wasn't dizzy.  Denies headache.  When testing arm drift pt slightly drops left arm and then raises it back up.

## 2019-12-02 NOTE — ED Notes (Signed)
Ian Montoya - [phone] Son is here and would like updates or when available

## 2020-04-30 IMAGING — DX DG CHEST 1V PORT
1 series · 1 of 1 positions shown · non-contrast
Comparison: Same day CT abdomen pelvis

CLINICAL DATA: Chest pain, shortness of breath, pulmonary opacities
on CT abdomen pelvis

EXAM:
PORTABLE CHEST 1 VIEW

[chest ap]
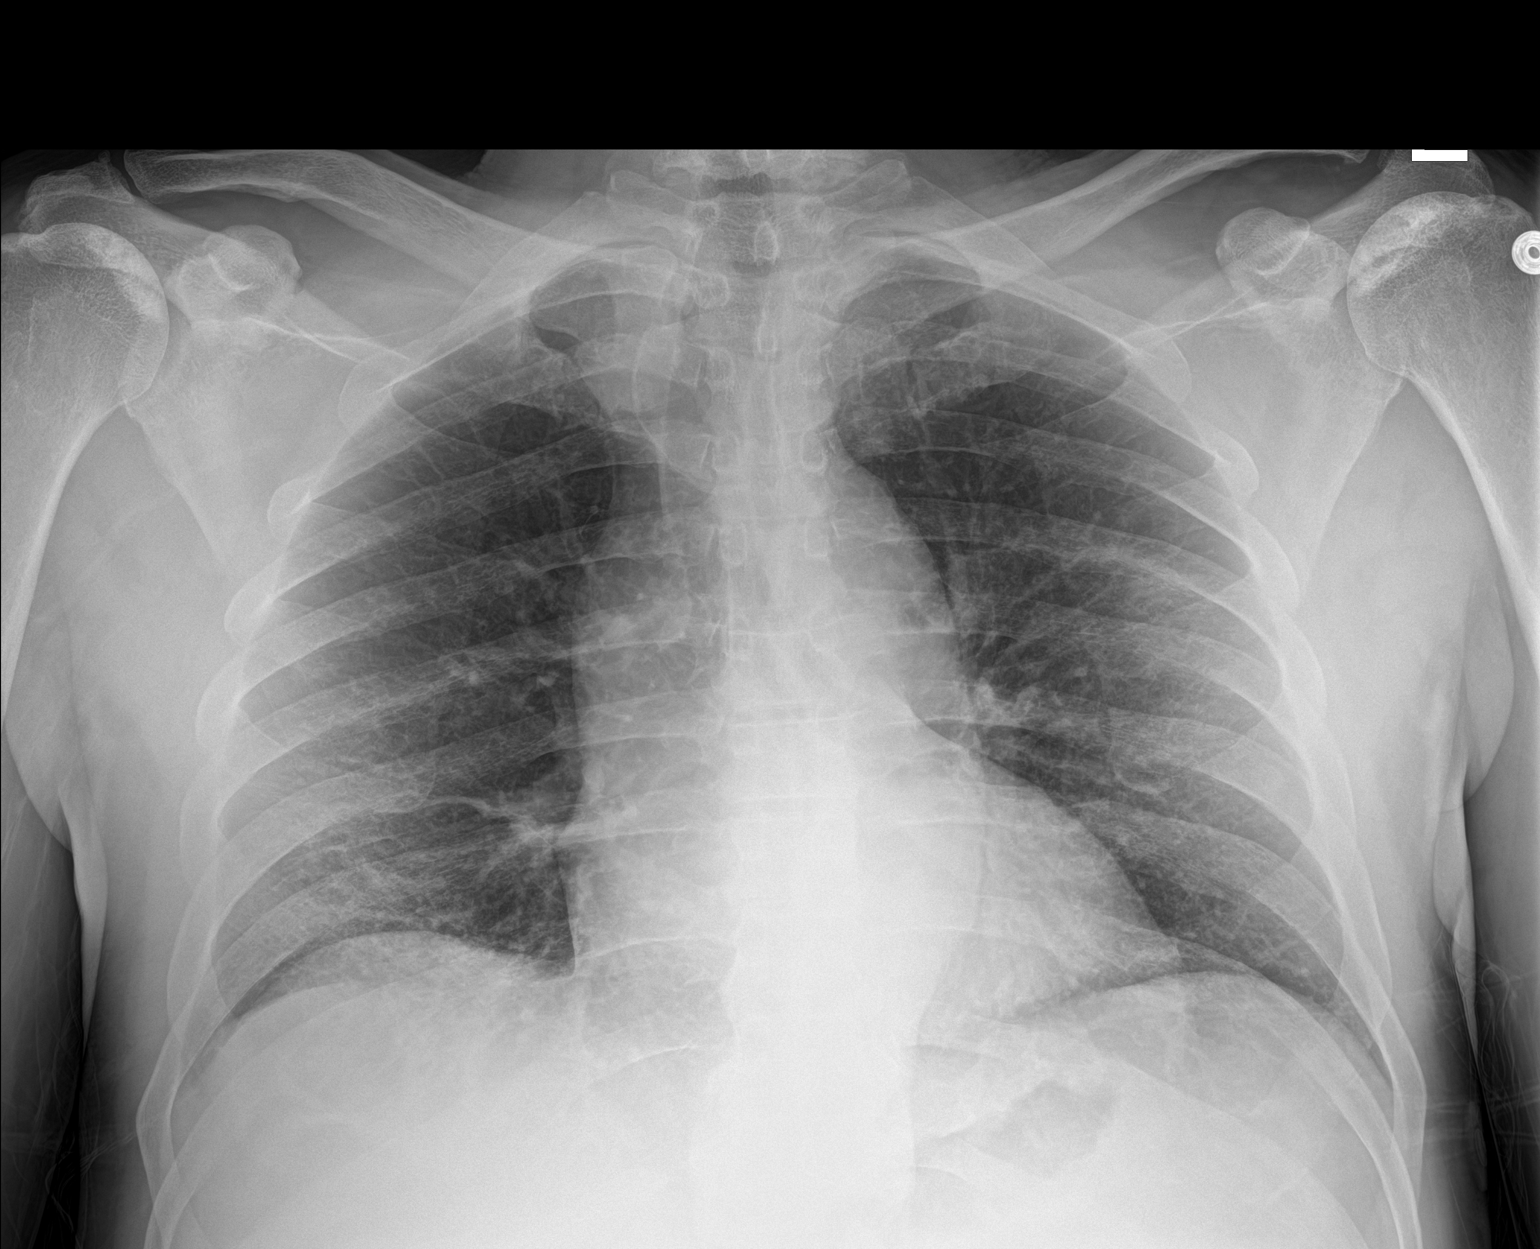

[1 of 1 positions shown; findings below may reference images not displayed]

FINDINGS: The heart size and mediastinal contours are within normal limits.
There are subtle heterogeneous airspace opacities of the peripheral
lower lungs. The visualized skeletal structures are unremarkable.
IMPRESSION: There are subtle heterogeneous airspace opacities of the peripheral
lower lungs, better demonstrated by prior CT and nonspecific,
infectious or inflammatory, differential considerations particularly
including [id number] airspace disease if clinically suspected.

## 2021-01-29 ENCOUNTER — Ambulatory Visit (HOSPITAL_COMMUNITY)
Admission: EM | Admit: 2021-01-29 | Discharge: 2021-01-29 | Disposition: A | Discharge status: Home / Self Care | Payer: No Typology Code available for payment source | Attending: Emergency Medicine | Admitting: Emergency Medicine

## 2021-01-29 ENCOUNTER — Encounter (HOSPITAL_COMMUNITY): Payer: Self-pay

## 2021-01-29 ENCOUNTER — Other Ambulatory Visit: Payer: Self-pay

## 2021-01-29 DIAGNOSIS — H1031 Unspecified acute conjunctivitis, right eye: Secondary | ICD-10-CM

## 2021-01-29 MED ORDER — OFLOXACIN 0.3 % OP SOLN: 1.0000 [drp] | Freq: Four times a day (QID) | OPHTHALMIC | 0 refills | Status: AC

## 2021-01-29 MED ORDER — OFLOXACIN 0.3 % OP SOLN: 1.0000 [drp] | Freq: Four times a day (QID) | OPHTHALMIC | Status: DC

## 2021-01-29 NOTE — ED Triage Notes (Signed)
Pt c/o right eye redness X 2 days. Pt denies itching.

## 2021-01-29 NOTE — ED Provider Notes (Signed)
Chief Complaint   Chief Complaint  Patient presents with  . Conjunctivitis    Subjective  Ian Montoya is a 65 y.o. male who presents with right eye redness and discharge over the last 2 days. Patient states that his granddaughter was recently treated for pink eye and states that she lives with him. He believes that he may have gotten this from her. Patient reports excessive drainage to the right eye which tends to blur his vision at times. Patient reports no pain, visual changes, eye trauma, foreign body, HA, weakness. They do not wear contacts.  History obtained from patient.  Patient's past medical, social, surgical, and medication history were reviewed by me and updated in Epic.    Review of Systems   See HPI.    Objective   Today's Vitals   01/29/21 1259 01/29/21 1300 01/29/21 1302  BP:   (!) 138/94  Pulse:  92   Resp:  17   Temp:  98.8 F (37.1 C)   TempSrc:  Oral   SpO2:  99%   PainSc: 0-No pain     There is no height or weight on file to calculate BMI.          General:  Alert, cooperative, appears stated age. No acute distress  Eyes:  Right: Injection noted to right conjunctiva and sclera. No bleeding or evidence of trauma. Excessive tearing noted. EOMI. PEERLA. Left: WNL. EOMI. PEERLA  Vision: No exam data present  Fluorescein:   Not needed      Assessment & Plan  1. Acute bacterial conjunctivitis of right eye   65 y.o. male presents with right eye redness and discharge over the last 2 days. Patient states that his granddaughter was recently treated for pink eye and states that she lives with him. He believes that he may have gotten this from her. Patient reports excessive drainage to the right eye which tends to blur his vision at times. Patient reports no pain, visual changes, eye trauma, foreign body, HA, weakness. They do not wear contacts. Chart review completed. Given symptoms along with assessment findings and having a close family member for whom  recently was treated for conjunctivitis, the patient likely has R bacterial conjunctivitis for which I will treat with ofloxacin drops and advise of at home treatment and care as outlined in the AVS. Return as needed. Patient verbalized understanding and agreed with plan. Patient stable upon discharge.  Plan: Pinkeye is redness and swelling of the eye surface and the conjunctiva (the lining of the eyelid and the covering of the white part of the eye). Pinkeye is also called conjunctivitis. Pinkeye is often caused by infection with bacteria or a virus. Dry air, allergies, smoke, and chemicals are other common causes. Pinkeye often clears on its own in 7 to 10 days. Antibiotics only help if the pinkeye is caused by bacteria. Pinkeye caused by infection spreads easily. If an allergy or chemical is causing pinkeye, it will not go away unless you can avoid whatever is causing it.  Use medication as directed.  Warm or cool compresses for 10 - 15 minutes every 6-8 hours.  Wash hands frequently and keep fingers away from eyes.  Don't wear contacts until you have finished your antibiotics or until you have followed up with your primary care provider or an eye doctor.  Call your primary care provider or return if you notice increased pain, redness, discharge, or decreased vision.    Meds ordered this encounter  Medications  .  ofloxacin (OCUFLOX) 0.3 % ophthalmic solution 1 drop    Amalia Greenhouse, FNP-C 01/29/2021  This note was partially made with the aid of speech-to-text dictation; typographical errors are not intentional.   Amalia Greenhouse, FNP 01/29/21 1327

## 2021-01-29 NOTE — Discharge Instructions (Signed)
Pinkeye is redness and swelling of the eye surface and the conjunctiva (the lining of the eyelid and the covering of the white part of the eye). Pinkeye is also called conjunctivitis. Pinkeye is often caused by infection with bacteria or a virus. Dry air, allergies, smoke, and chemicals are other common causes.  Pinkeye often clears on its own in 7 to 10 days. Antibiotics only help if the pinkeye is caused by bacteria. Pinkeye caused by infection spreads easily. If an allergy or chemical is causing pinkeye, it will not go away unless you can avoid whatever is causing it.  Use medication as directed. Warm or cool compresses for 10 - 15 minutes every 6-8 hours. Wash hands frequently and keep fingers away from eyes. Don't wear contacts until you have finished your antibiotics or until you have followed up with your primary care provider or an eye doctor.  Call your primary care provider or return if you notice increased pain, redness, discharge, or decreased vision.  

## 2021-08-28 DIAGNOSIS — Z1211 Encounter for screening for malignant neoplasm of colon: Secondary | ICD-10-CM | POA: Diagnosis not present

## 2022-05-11 ENCOUNTER — Telehealth: Payer: Self-pay

## 2022-05-11 NOTE — Patient Outreach (Signed)
  Care Coordination   05/11/2022 Name: Ian Montoya MRN: [id number] DOB: [birth year]   Care Coordination Outreach Attempts:  An unsuccessful telephone outreach was attempted today to offer the patient information about available care coordination services as a benefit of their health plan.   Follow Up Plan:  Additional outreach attempts will be made to offer the patient care coordination information and services.   Encounter Outcome:  No Answer  Care Coordination Interventions Activated:  No   Care Coordination Interventions:  No, not indicated    [facility] [phone]

## 2022-05-12 ENCOUNTER — Telehealth: Payer: Self-pay

## 2022-05-12 NOTE — Patient Instructions (Signed)
Visit Information  Thank you for taking time to speak with me today. Please don't hesitate to call if you require additional assistance.  Following are the goals we discussed today:   Goals Addressed             This Visit's Progress    COMPLETED: Care Coordination Activities       Care Coordination Interventions: Reviewed plan for disease management. Reports currently receiving primary care services via the [facility] clinic. Reviewed medications. Reports managing well. Denies concerns r/t medication management or prescription cost. Assessed social determinant of health barriers.        Ian Montoya verbalized understanding of the information discussed during the telephonic outreach. Declined need for mailed instructions or resources.  No further follow up required.   [facility] Care Management [phone]

## 2022-05-12 NOTE — Patient Outreach (Signed)
  Care Coordination   Initial Visit Note   05/12/2022 Name: Ian Montoya MRN: [id number] DOB: 1955-11-07  Ian Montoya is a 66 y.o. year old male who sees Patient, No Pcp Per for primary care. I spoke with  Kerrie Pleasure by phone today.  What matters to the patients health and wellness today?  No Concerns Expressed    Goals Addressed             This Visit's Progress    COMPLETED: Care Coordination Activities       Care Coordination Interventions: Reviewed plan for disease management. Reports currently receiving primary care services via the [facility] clinic. Reviewed medications. Reports managing well. Denies concerns r/t medication management or prescription cost. Assessed social determinant of health barriers.        SDOH assessments and interventions completed:  Yes  SDOH Interventions Today    Flowsheet Row Most Recent Value  SDOH Interventions   Food Insecurity Interventions Intervention Not Indicated  Transportation Interventions Intervention Not Indicated        Care Coordination Interventions Activated:  Yes  Care Coordination Interventions:  Yes, provided   Follow up plan: No further intervention required.   Encounter Outcome:  Pt. Visit Completed   [facility] Care Management [phone]

## 2022-11-19 DIAGNOSIS — Z0000 Encounter for general adult medical examination without abnormal findings: Secondary | ICD-10-CM | POA: Diagnosis not present

## 2022-11-19 DIAGNOSIS — Z136 Encounter for screening for cardiovascular disorders: Secondary | ICD-10-CM | POA: Diagnosis not present

## 2022-11-19 DIAGNOSIS — Z1159 Encounter for screening for other viral diseases: Secondary | ICD-10-CM | POA: Diagnosis not present

## 2022-11-19 DIAGNOSIS — Z13220 Encounter for screening for lipoid disorders: Secondary | ICD-10-CM | POA: Diagnosis not present

## 2022-11-19 DIAGNOSIS — Z131 Encounter for screening for diabetes mellitus: Secondary | ICD-10-CM | POA: Diagnosis not present

## 2022-11-19 DIAGNOSIS — Z125 Encounter for screening for malignant neoplasm of prostate: Secondary | ICD-10-CM | POA: Diagnosis not present

## 2022-11-25 DIAGNOSIS — I10 Essential (primary) hypertension: Secondary | ICD-10-CM | POA: Diagnosis not present

## 2022-11-25 DIAGNOSIS — Z833 Family history of diabetes mellitus: Secondary | ICD-10-CM | POA: Diagnosis not present

## 2022-11-25 DIAGNOSIS — R69 Illness, unspecified: Secondary | ICD-10-CM | POA: Diagnosis not present

## 2022-11-25 DIAGNOSIS — Z8249 Family history of ischemic heart disease and other diseases of the circulatory system: Secondary | ICD-10-CM | POA: Diagnosis not present

## 2022-11-25 DIAGNOSIS — Z888 Allergy status to other drugs, medicaments and biological substances status: Secondary | ICD-10-CM | POA: Diagnosis not present

## 2022-11-25 DIAGNOSIS — M1990 Unspecified osteoarthritis, unspecified site: Secondary | ICD-10-CM | POA: Diagnosis not present

## 2022-11-25 DIAGNOSIS — H409 Unspecified glaucoma: Secondary | ICD-10-CM | POA: Diagnosis not present

## 2022-11-25 DIAGNOSIS — Z79891 Long term (current) use of opiate analgesic: Secondary | ICD-10-CM | POA: Diagnosis not present

## 2023-12-26 DIAGNOSIS — R7309 Other abnormal glucose: Secondary | ICD-10-CM | POA: Diagnosis not present

## 2023-12-26 DIAGNOSIS — E785 Hyperlipidemia, unspecified: Secondary | ICD-10-CM | POA: Diagnosis not present

## 2023-12-26 DIAGNOSIS — Z0000 Encounter for general adult medical examination without abnormal findings: Secondary | ICD-10-CM | POA: Diagnosis not present

## 2023-12-26 DIAGNOSIS — Z125 Encounter for screening for malignant neoplasm of prostate: Secondary | ICD-10-CM | POA: Diagnosis not present

## 2023-12-26 DIAGNOSIS — R030 Elevated blood-pressure reading, without diagnosis of hypertension: Secondary | ICD-10-CM | POA: Diagnosis not present

## 2023-12-26 DIAGNOSIS — R5383 Other fatigue: Secondary | ICD-10-CM | POA: Diagnosis not present

## 2023-12-26 DIAGNOSIS — Z130 Encounter for screening for diseases of the blood and blood-forming organs and certain disorders involving the immune mechanism: Secondary | ICD-10-CM | POA: Diagnosis not present

## 2024-02-27 DIAGNOSIS — Z0131 Encounter for examination of blood pressure with abnormal findings: Secondary | ICD-10-CM | POA: Diagnosis not present

## 2024-02-27 DIAGNOSIS — Z008 Encounter for other general examination: Secondary | ICD-10-CM | POA: Diagnosis not present

## 2024-03-29 DIAGNOSIS — E538 Deficiency of other specified B group vitamins: Secondary | ICD-10-CM | POA: Diagnosis not present

## 2024-03-29 DIAGNOSIS — E559 Vitamin D deficiency, unspecified: Secondary | ICD-10-CM | POA: Diagnosis not present

## 2024-03-29 DIAGNOSIS — E785 Hyperlipidemia, unspecified: Secondary | ICD-10-CM | POA: Diagnosis not present

## 2024-05-09 DIAGNOSIS — N183 Chronic kidney disease, stage 3 unspecified: Secondary | ICD-10-CM | POA: Diagnosis not present

## 2024-05-21 DIAGNOSIS — R7303 Prediabetes: Secondary | ICD-10-CM | POA: Diagnosis not present

## 2024-05-21 DIAGNOSIS — H409 Unspecified glaucoma: Secondary | ICD-10-CM | POA: Diagnosis not present

## 2024-05-21 DIAGNOSIS — Z008 Encounter for other general examination: Secondary | ICD-10-CM | POA: Diagnosis not present

## 2024-05-21 DIAGNOSIS — F319 Bipolar disorder, unspecified: Secondary | ICD-10-CM | POA: Diagnosis not present

## 2024-05-21 DIAGNOSIS — Z6825 Body mass index (BMI) 25.0-25.9, adult: Secondary | ICD-10-CM | POA: Diagnosis not present

## 2024-05-21 DIAGNOSIS — F1120 Opioid dependence, uncomplicated: Secondary | ICD-10-CM | POA: Diagnosis not present

## 2024-05-21 DIAGNOSIS — E663 Overweight: Secondary | ICD-10-CM | POA: Diagnosis not present

## 2024-05-21 DIAGNOSIS — F17211 Nicotine dependence, cigarettes, in remission: Secondary | ICD-10-CM | POA: Diagnosis not present

## 2024-05-21 DIAGNOSIS — E785 Hyperlipidemia, unspecified: Secondary | ICD-10-CM | POA: Diagnosis not present

## 2024-06-05 DIAGNOSIS — E785 Hyperlipidemia, unspecified: Secondary | ICD-10-CM | POA: Diagnosis not present
